# Patient Record
Sex: Female | Born: 1955 | Race: White | Hispanic: No | Marital: Single | Smoking: Current every day smoker
Health system: Southern US, Community
[De-identification: ages and names within clinical notes are randomized; demographics above are authoritative.]

## PROBLEM LIST (undated history)

## (undated) DIAGNOSIS — M1990 Unspecified osteoarthritis, unspecified site: Secondary | ICD-10-CM

## (undated) DIAGNOSIS — M21619 Bunion of unspecified foot: Secondary | ICD-10-CM

## (undated) DIAGNOSIS — T7840XA Allergy, unspecified, initial encounter: Secondary | ICD-10-CM

## (undated) DIAGNOSIS — M549 Dorsalgia, unspecified: Secondary | ICD-10-CM

## (undated) DIAGNOSIS — G5600 Carpal tunnel syndrome, unspecified upper limb: Secondary | ICD-10-CM

## (undated) DIAGNOSIS — R002 Palpitations: Secondary | ICD-10-CM

## (undated) HISTORY — PX: CHOLECYSTECTOMY: SHX55

## (undated) HISTORY — DX: Unspecified osteoarthritis, unspecified site: M19.90

## (undated) HISTORY — DX: Bunion of unspecified foot: M21.619

## (undated) HISTORY — PX: BACK SURGERY: SHX140

## (undated) HISTORY — DX: Allergy, unspecified, initial encounter: T78.40XA

## (undated) HISTORY — PX: ABDOMINAL HYSTERECTOMY: SHX81

## (undated) HISTORY — DX: Carpal tunnel syndrome, unspecified upper limb: G56.00

---

## 1978-04-23 HISTORY — PX: TUBAL LIGATION: SHX77

## 1988-04-23 HISTORY — PX: GUM SURGERY: SHX658

## 2003-04-24 HISTORY — PX: BACK SURGERY: SHX140

## 2003-04-24 HISTORY — PX: NECK SURGERY: SHX720

## 2010-06-29 DIAGNOSIS — F17200 Nicotine dependence, unspecified, uncomplicated: Secondary | ICD-10-CM | POA: Insufficient documentation

## 2010-06-29 DIAGNOSIS — Q631 Lobulated, fused and horseshoe kidney: Secondary | ICD-10-CM | POA: Insufficient documentation

## 2010-06-29 DIAGNOSIS — N200 Calculus of kidney: Secondary | ICD-10-CM | POA: Insufficient documentation

## 2010-07-31 DIAGNOSIS — M47812 Spondylosis without myelopathy or radiculopathy, cervical region: Secondary | ICD-10-CM | POA: Insufficient documentation

## 2010-07-31 DIAGNOSIS — M549 Dorsalgia, unspecified: Secondary | ICD-10-CM | POA: Insufficient documentation

## 2011-03-05 ENCOUNTER — Emergency Department: Payer: Self-pay | Admitting: *Deleted

## 2012-05-30 DIAGNOSIS — R002 Palpitations: Secondary | ICD-10-CM | POA: Insufficient documentation

## 2012-05-30 DIAGNOSIS — M159 Polyosteoarthritis, unspecified: Secondary | ICD-10-CM | POA: Insufficient documentation

## 2012-12-15 DIAGNOSIS — J309 Allergic rhinitis, unspecified: Secondary | ICD-10-CM | POA: Insufficient documentation

## 2014-07-28 ENCOUNTER — Ambulatory Visit (INDEPENDENT_AMBULATORY_CARE_PROVIDER_SITE_OTHER): Payer: BLUE CROSS/BLUE SHIELD

## 2014-07-28 ENCOUNTER — Encounter: Payer: Self-pay | Admitting: Podiatry

## 2014-07-28 ENCOUNTER — Ambulatory Visit (INDEPENDENT_AMBULATORY_CARE_PROVIDER_SITE_OTHER): Payer: BLUE CROSS/BLUE SHIELD | Admitting: Podiatry

## 2014-07-28 VITALS — BP 115/74 | HR 62 | Resp 16 | Ht 64.0 in | Wt 153.0 lb

## 2014-07-28 DIAGNOSIS — M79671 Pain in right foot: Secondary | ICD-10-CM

## 2014-07-28 DIAGNOSIS — M7751 Other enthesopathy of right foot: Secondary | ICD-10-CM

## 2014-07-28 DIAGNOSIS — M779 Enthesopathy, unspecified: Secondary | ICD-10-CM

## 2014-07-28 DIAGNOSIS — M778 Other enthesopathies, not elsewhere classified: Secondary | ICD-10-CM

## 2014-07-28 MED ORDER — MELOXICAM 15 MG PO TABS: 15.0000 mg | ORAL_TABLET | Freq: Every day | ORAL | Status: DC

## 2014-07-28 MED ORDER — METHYLPREDNISOLONE (PAK) 4 MG PO TABS: ORAL_TABLET | ORAL | Status: DC

## 2014-07-28 NOTE — Progress Notes (Signed)
   Subjective:    Patient ID: Gray Bernhardtolly Biedermann, female    DOB: 1956-01-03, 59 y.o.   MRN: [id number]  HPI    Review of Systems  Constitutional: Positive for fatigue.  HENT: Positive for sinus pressure.   All other systems reviewed and are negative.      Objective:   Physical Exam: I have reviewed her past medical history medications allergies surgery social history and review of systems. Pulses are strongly palpable bilateral. Neurologic sensorium is intact percent was the monofilament. Deep tendon reflexes are intact bilateral and muscle strength is 5 over 5 dorsiflexion plantar flexors and inverters everters all intrinsic musculature is intact. Orthopedic evaluation demonstrates hallux abductovalgus deformity of the bilateral foot right greater than left with hammertoe deformity second digit right is mild deviation of the second digit of the metatarsophalangeal joint and pain on palpation and in range of motion of the second metatarsophalangeal joint right foot. Radiographs confirm hallux abductovalgus deformity with plantarflexed elongated second metatarsal and hammertoe deformity. Cutaneous evaluation demonstrates supple well-hydrated cutis no erythema edema saline as drainage or odor with the exception of mild erythema to the plantar aspect of the second metatarsophalangeal joint which is painful on palpation.        Assessment & Plan:  Assessment: Hallux abductovalgus deformity with chronic capsulitis second metatarsophalangeal joint right foot hammertoe deformity second right.  Plan: Discussed the etiology pathology conservative versus surgical therapies. At this point I injected peri-articularly today with Kenalog and local anesthetic after sterile Betadine skin prep. Started her on a Medrol Dosepak to be followed by meloxicam. Placed her in a Darco shoe.

## 2014-08-25 ENCOUNTER — Ambulatory Visit: Payer: BLUE CROSS/BLUE SHIELD | Admitting: Podiatry

## 2014-09-15 ENCOUNTER — Ambulatory Visit (INDEPENDENT_AMBULATORY_CARE_PROVIDER_SITE_OTHER): Payer: BLUE CROSS/BLUE SHIELD | Admitting: Podiatry

## 2014-09-15 ENCOUNTER — Encounter: Payer: Self-pay | Admitting: Podiatry

## 2014-09-15 VITALS — BP 114/68 | HR 69 | Resp 16

## 2014-09-15 DIAGNOSIS — G5790 Unspecified mononeuropathy of unspecified lower limb: Secondary | ICD-10-CM | POA: Diagnosis not present

## 2014-09-15 MED ORDER — PREGABALIN 75 MG PO CAPS: 75.0000 mg | ORAL_CAPSULE | Freq: Two times a day (BID) | ORAL | Status: DC

## 2014-09-15 NOTE — Progress Notes (Signed)
She presents today for follow-up of her capsulitis of her bilateral foot second metatarsophalangeal joint. She states that the meloxicam and the injections really seem to help with the pain in the forefoot. However she notes that she is still having severe burning pain throughout the day and particularly at night.  Objective: Vital signs are stable alert and oriented 3. Pulses are strongly palpable bilateral. Neurologic sensorium is intact per Semmes-Weinstein monofilament. Deep tendon reflexes are intact bilateral.. Decrease in tenderness on range of motion of the second metatarsophalangeal joint.  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 Resolving capsulitis second metatarsophalangeal joint bilateral.  Plan: Discussed etiology pathology conservative versus surgical therapies. Started her on Lyrica 75 mg 1 twice daily and I will follow-up with her in 1 month. We discussed the pros and cons of Lyrica including all the side effects. She will call with concerns.

## 2014-10-04 ENCOUNTER — Emergency Department: Payer: 59

## 2014-10-04 ENCOUNTER — Emergency Department
Admission: EM | Admit: 2014-10-04 | Discharge: 2014-10-04 | Disposition: A | Discharge status: Home / Self Care | Payer: 59 | Attending: Emergency Medicine | Admitting: Emergency Medicine

## 2014-10-04 ENCOUNTER — Encounter: Payer: Self-pay | Admitting: Emergency Medicine

## 2014-10-04 DIAGNOSIS — Z720 Tobacco use: Secondary | ICD-10-CM | POA: Insufficient documentation

## 2014-10-04 DIAGNOSIS — G8929 Other chronic pain: Secondary | ICD-10-CM | POA: Diagnosis not present

## 2014-10-04 DIAGNOSIS — Z981 Arthrodesis status: Secondary | ICD-10-CM | POA: Insufficient documentation

## 2014-10-04 DIAGNOSIS — Z79899 Other long term (current) drug therapy: Secondary | ICD-10-CM | POA: Diagnosis not present

## 2014-10-04 DIAGNOSIS — M545 Low back pain: Secondary | ICD-10-CM | POA: Insufficient documentation

## 2014-10-04 DIAGNOSIS — M542 Cervicalgia: Secondary | ICD-10-CM | POA: Insufficient documentation

## 2014-10-04 DIAGNOSIS — R350 Frequency of micturition: Secondary | ICD-10-CM | POA: Insufficient documentation

## 2014-10-04 DIAGNOSIS — M549 Dorsalgia, unspecified: Secondary | ICD-10-CM

## 2014-10-04 DIAGNOSIS — Z791 Long term (current) use of non-steroidal anti-inflammatories (NSAID): Secondary | ICD-10-CM | POA: Diagnosis not present

## 2014-10-04 HISTORY — DX: Dorsalgia, unspecified: M54.9

## 2014-10-04 HISTORY — DX: Palpitations: R00.2

## 2014-10-04 LAB — URINALYSIS COMPLETE WITH MICROSCOPIC (ARMC ONLY)
Bacteria, UA: NONE SEEN
Bilirubin Urine: NEGATIVE
GLUCOSE, UA: NEGATIVE mg/dL
KETONES UR: NEGATIVE mg/dL
Leukocytes, UA: NEGATIVE
Nitrite: NEGATIVE
PH: 6 (ref 5.0–8.0)
Protein, ur: NEGATIVE mg/dL
Specific Gravity, Urine: 1.002 — ABNORMAL LOW (ref 1.005–1.030)

## 2014-10-04 MED ORDER — ORPHENADRINE CITRATE 30 MG/ML IJ SOLN
60.0000 mg | Freq: Two times a day (BID) | INTRAMUSCULAR | Status: DC
  Administered 2014-10-04: 60 mg via INTRAMUSCULAR

## 2014-10-04 MED ORDER — KETOROLAC TROMETHAMINE 60 MG/2ML IM SOLN
60.0000 mg | Freq: Once | INTRAMUSCULAR | Status: AC
  Administered 2014-10-04: 60 mg via INTRAMUSCULAR

## 2014-10-04 MED ORDER — KETOROLAC TROMETHAMINE 60 MG/2ML IM SOLN
INTRAMUSCULAR | Status: AC
  Filled 2014-10-04: qty 2

## 2014-10-04 MED ORDER — ORPHENADRINE CITRATE 30 MG/ML IJ SOLN
INTRAMUSCULAR | Status: AC
  Administered 2014-10-04: 60 mg via INTRAMUSCULAR
  Filled 2014-10-04: qty 2

## 2014-10-04 MED ORDER — CYCLOBENZAPRINE HCL 10 MG PO TABS: 10.0000 mg | ORAL_TABLET | Freq: Three times a day (TID) | ORAL | Status: DC | PRN

## 2014-10-04 NOTE — Discharge Instructions (Signed)
Alternate heat and ice for comfort. Take medication as prescribed. Continue home Mobic for pain as needed. Do NOT take hydromorphone pain medication with muscle relaxant as discussed. This is very important.  Follow-up with her primary care physician. Also see above for follow-up.  Return to the ER for new or worsening concerns.  Back Pain, Adult Low back pain is very common. About 1 in 5 people have back pain.The cause of low back pain is rarely dangerous. The pain often gets better over time.About half of people with a sudden onset of back pain feel better in just 2 weeks. About 8 in 10 people feel better by 6 weeks.  CAUSES Some common causes of back pain include:  Strain of the muscles or ligaments supporting the spine.  Wear and tear (degeneration) of the spinal discs.  Arthritis.  Direct injury to the back. DIAGNOSIS Most of the time, the direct cause of low back pain is not known.However, back pain can be treated effectively even when the exact cause of the pain is unknown.Answering your caregiver's questions about your overall health and symptoms is one of the most accurate ways to make sure the cause of your pain is not dangerous. If your caregiver needs more information, he or she may order lab work or imaging tests (X-rays or MRIs).However, even if imaging tests show changes in your back, this usually does not require surgery. HOME CARE INSTRUCTIONS For many people, back pain returns.Since low back pain is rarely dangerous, it is often a condition that people can learn to [facility] their own.   Remain active. It is stressful on the back to sit or stand in one place. Do not sit, drive, or stand in one place for more than 30 minutes at a time. Take short walks on level surfaces as soon as pain allows.Try to increase the length of time you walk each day.  Do not stay in bed.Resting more than 1 or 2 days can delay your recovery.  Do not avoid exercise or work.Your body is  made to move.It is not dangerous to be active, even though your back may hurt.Your back will likely heal faster if you return to being active before your pain is gone.  Pay attention to your body when you bend and lift. Many people have less discomfortwhen lifting if they bend their knees, keep the load close to their bodies,and avoid twisting. Often, the most comfortable positions are those that put less stress on your recovering back.  Find a comfortable position to sleep. Use a firm mattress and lie on your side with your knees slightly bent. If you lie on your back, put a pillow under your knees.  Only take over-the-counter or prescription medicines as directed by your caregiver. Over-the-counter medicines to reduce pain and inflammation are often the most helpful.Your caregiver may prescribe muscle relaxant drugs.These medicines help dull your pain so you can more quickly return to your normal activities and healthy exercise.  Put ice on the injured area.  Put ice in a plastic bag.  Place a towel between your skin and the bag.  Leave the ice on for 15-20 minutes, 03-04 times a day for the first 2 to 3 days. After that, ice and heat may be alternated to reduce pain and spasms.  Ask your caregiver about trying back exercises and gentle massage. This may be of some benefit.  Avoid feeling anxious or stressed.Stress increases muscle tension and can worsen back pain.It is important to recognize when  you are anxious or stressed and learn ways to manage it.Exercise is a great option. SEEK MEDICAL CARE IF:  You have pain that is not relieved with rest or medicine.  You have pain that does not improve in 1 week.  You have new symptoms.  You are generally not feeling well. SEEK IMMEDIATE MEDICAL CARE IF:   You have pain that radiates from your back into your legs.  You develop new bowel or bladder control problems.  You have unusual weakness or numbness in your arms or  legs.  You develop nausea or vomiting.  You develop abdominal pain.  You feel faint. Document Released: 04/09/2005 Document Revised: 10/09/2011 Document Reviewed: 08/11/2013 [facility] Patient Information 2015 [place], [place]. This information is not intended to replace advice given to you by your health care provider. Make sure you discuss any questions you have with your health care provider.

## 2014-10-04 NOTE — ED Notes (Signed)
States has all over aches and pains but particularly back pain. States has history of back pain but worse x 1 month with no new injury. No fevers. No dysuria.

## 2014-10-04 NOTE — ED Provider Notes (Signed)
[facility] Emergency Department Provider Note ____________________________________________  Time seen: Approximately 10:07 AM  I have reviewed the triage vital signs and the nursing notes.   HISTORY  Chief Complaint Back Pain   HPI Michelle Boyle is a 59 y.o. female presents to the ER for complaints of neck and back pain. Patient reports that she has chronic pain in all of her joints, however states that for the last week or 2 she has had increased pain in her neck and lower back. Reports history of cervical and lumbar fusion. Patient denies fall or other injury. Patient reports that she has hydromorphone 2 mg tablets as needed for pain as well as 15 mg Mobic daily for pain. States the hydromorphone has only been taken once or twice but did not relieve the pain.   States pain is currently 8 out of 10 to neck and lower back. Denies pain radiation. Patient states pain is constant always, worse with movement. Patient does report that she has had this pain for years however states that it intermittently flares up. Patient again denies fall or injury.  Denies chest pain, shortness of breath, abdominal pain, fever, vomiting. Reports occasional urinary frequency. Also reports occasional nausea when pain increases. Denies current nausea. States that she has seen her primary care physician in past for similar and was referred to spine clinic and [facility]. Patient states that she recently moved to [place].  Patient reports that she was recently seen in 2-3 weeks ago at [facility] ER for acute diverticulitis. Patient states that that has improved and resolved.States that's when she received the hydromorphone tablets.    Past Medical History  Diagnosis Date  . Back pain   . Palpitations    chronic neck and back pain Migraines   There are no active problems to display for this patient.   Past Surgical History  Procedure Laterality Date  . Back surgery    . Abdominal hysterectomy     . Cholecystectomy     cervical and lumbar fusion  Current Outpatient Rx  Name  Route  Sig  Dispense  Refill  . atenolol-chlorthalidone (TENORETIC) 50-25 MG per tablet   Oral   Take 1 tablet by mouth daily.         . cyclobenzaprine (FLEXERIL) 10 MG tablet   Oral   Take 1 tablet (10 mg total) by mouth every 8 (eight) hours as needed for muscle spasms (PRN pain. Do not drive or operate heavy machinery while taking as can cause drowsiness. DO NOT take with other pain medication.).   12 tablet   0   . meloxicam (MOBIC) 15 MG tablet   Oral   Take 1 tablet (15 mg total) by mouth daily.   30 tablet   3   . pregabalin (LYRICA) 75 MG capsule   Oral   Take 1 capsule (75 mg total) by mouth 2 (two) times daily.   60 capsule   3   . sertraline (ZOLOFT) 100 MG tablet   Oral   Take 100 mg by mouth daily.          hydromorphone tabs  tab.  #10 in bottle in room  Allergies Codeine; Levaquin; and Sulfa antibiotics  No family history on file.  Social History History  Substance Use Topics  . Smoking status: Current Every Day Smoker -- 0.50 packs/day    Types: Cigarettes  . Smokeless tobacco: Not on file  . Alcohol Use: No    Review of Systems  Constitutional: No fever/chills Eyes: No visual changes. ENT: No sore throat. Cardiovascular: Denies chest pain. Respiratory: Denies shortness of breath. Gastrointestinal: No abdominal pain.  No nausea, no vomiting.  No diarrhea.  No constipation. Genitourinary: Negative for dysuria. Musculoskeletal: Positive for neck and for back pain. Skin: Negative for rash. Neurological: Negative for headaches, focal weakness or numbness.  10-point ROS otherwise negative.  ____________________________________________   PHYSICAL EXAM:  VITAL SIGNS: ED Triage Vitals  Enc Vitals Group     BP 10/04/14 0934 137/70 mmHg     Pulse Rate 10/04/14 0934 74     Resp 10/04/14 0934 20     Temp 10/04/14 0934 98.2 F (36.8 C)     Temp Source  10/04/14 0934 Oral     SpO2 10/04/14 0934 96 %     Weight 10/04/14 0934 155 lb (70.308 kg)     Height 10/04/14 0934 5\' 4"  (1.626 m)     Head Cir --      Peak Flow --      Pain Score 10/04/14 0935 10     Pain Loc --      Pain Edu? --      Excl. in GC? --     Constitutional: Alert and oriented. Well appearing and in no acute distress. Eyes: Conjunctivae are normal. PERRL. EOMI. Head: Atraumatic. Nose: No congestion/rhinnorhea. Mouth/Throat: Mucous membranes are moist.  Oropharynx non-erythematous. Neck: No stridor.  No cervical spine tenderness to palpation. Hematological/Lymphatic/Immunilogical: No cervical lymphadenopathy. Cardiovascular: Normal rate, regular rhythm. Grossly normal heart sounds.  Good peripheral circulation. Respiratory: Normal respiratory effort.  No retractions. Lungs CTAB. Gastrointestinal: Soft and nontender. No distention. No abdominal bruits. No CVA tenderness. Musculoskeletal: No lower extremity tenderness nor edema.  No joint effusions. Mild to mod cervical and paracervical TTP, mild to mod TTP lumbar and paralumbar TTP, Full ROM. Changes positions from lying to standing quickly without difficulty or distress. Steady gait. Bilateral pedal pulses equal and easily found. No saddle anesthesia. Sensation intact bilaterally to upper and lower extremities. Bilateral hand grips equal.  Neurologic:  Normal speech and language. No gross focal neurologic deficits are appreciated. Speech is normal. No gait instability. CN2-12 grossly intact.  Skin:  Skin is warm, dry and intact. No rash noted. Psychiatric: Mood and affect are normal. Speech and behavior are normal.  ____________________________________________   LABS (all labs ordered are listed, but only abnormal results are displayed)  Labs Reviewed  URINALYSIS COMPLETEWITH MICROSCOPIC ([facility] ONLY) - Abnormal; Notable for the following:    Color, Urine STRAW (*)    APPearance CLEAR (*)    Specific Gravity, Urine  1.002 (*)    Hgb urine dipstick 1+ (*)    Squamous Epithelial / LPF 0-5 (*)    All other components within normal limits  URINE CULTURE   ____________________________________________  __________________________________________  RADIOLOGY LUMBAR SPINE - COMPLETE 4+ VIEW  COMPARISON: None.  FINDINGS: Five views of lumbar spine submitted. No acute fracture or subluxation. There is posterior metallic fusion with metallic rods and transpedicular screws at L5-S1 level. Alignment and vertebral body heights are preserved. Mild facet degenerative changes L3 and L4 level.  IMPRESSION: No acute fracture or subluxation. Postsurgical changes at L5-S1 level. Mild degenerative changes as described above.   Electronically Signed By: Natasha Mead M.D. On: 10/04/2014 10:58 CT CERVICAL SPINE WITHOUT CONTRAST  TECHNIQUE: Multidetector CT imaging of the cervical spine was performed without intravenous contrast. Multiplanar CT image reconstructions were also generated.  COMPARISON: None.  FINDINGS: Slight straightening  of the normal cervical lordosis. C5-6 anterior cervical fusion without complicating feature. Vertebral body height is maintained. Endplate degenerative changes and loss of disc space height are seen at C6-7. Multilevel facet hypertrophy. Mild-to-moderate bilateral neural foraminal narrowing at C5-6 and C6-7.  Visualized portions of the lung apices show no acute findings. Soft tissues are unremarkable.  IMPRESSION: 1. No acute findings. 2. Multilevel facet hypertrophy with degenerative disc disease at C6-7. 3. C5-6 anterior cervical fusion.   Electronically Signed By: Leanna Battles M.D. On: 10/04/2014 10:47 ____________________________________________   INITIAL IMPRESSION / ASSESSMENT AND PLAN / ED COURSE  Pertinent labs & imaging results that were available during my care of the patient were reviewed by me and considered in my medical decision  making (see chart for details).  No acute distress. Very well-appearing patient. Presents to the ER for acute on chronic neck and low back pain. Denies fall or injury. Reports has been following with [facility] spine clinic in past for similar.  Patient ambulatory in ER and relating in hallway. Presents to the ER for acute on chronic neck and back pain. CT of neck revealing no acute findings as well as lumbar x-ray revealing no acute fracture or subluxation. Discussed with patient need to follow up closely with primary care physician. Discussed also will refer for pain management. We'll treat patient with IM Toradol and Norflex once in ER. Patient states that she does not like taking the hydromorphone as it does not work and makes her drowsy. Discussed not taking hydromorphone, will prescribe Flexeril as needed for pain. Discussed importance of monitoring medication use and to not take pain medications together. Patient verbalized understanding. Friend at bedside also verbalized understanding. Discussed strict follow-up and return parameters. ____________________________________________   FINAL CLINICAL IMPRESSION(S) / ED DIAGNOSES  Final diagnoses:  Chronic neck and back pain   Acute on chronic neck and back pain   Renford Dills, NP 10/04/14 1208  Phineas Semen, MD 10/04/14 [phone]

## 2014-10-04 NOTE — ED Notes (Signed)
Pt states that she has pain in her back and neck. Has a history of back and neck pain. States that she has nausea with pain. No apparent distress. Airway and circulation intact.

## 2014-10-06 LAB — URINE CULTURE: Culture: 10000

## 2014-10-11 ENCOUNTER — Ambulatory Visit (INDEPENDENT_AMBULATORY_CARE_PROVIDER_SITE_OTHER): Payer: 59 | Admitting: Family Medicine

## 2014-10-11 ENCOUNTER — Encounter: Payer: Self-pay | Admitting: Family Medicine

## 2014-10-11 VITALS — BP 111/76 | HR 73 | Temp 97.8°F | Resp 16 | Ht 64.0 in | Wt 158.8 lb

## 2014-10-11 DIAGNOSIS — Z0289 Encounter for other administrative examinations: Secondary | ICD-10-CM | POA: Diagnosis not present

## 2014-10-11 DIAGNOSIS — F329 Major depressive disorder, single episode, unspecified: Secondary | ICD-10-CM

## 2014-10-11 DIAGNOSIS — G5601 Carpal tunnel syndrome, right upper limb: Secondary | ICD-10-CM

## 2014-10-11 DIAGNOSIS — G8929 Other chronic pain: Secondary | ICD-10-CM

## 2014-10-11 DIAGNOSIS — G5603 Carpal tunnel syndrome, bilateral upper limbs: Secondary | ICD-10-CM

## 2014-10-11 DIAGNOSIS — K5732 Diverticulitis of large intestine without perforation or abscess without bleeding: Secondary | ICD-10-CM

## 2014-10-11 DIAGNOSIS — Z8249 Family history of ischemic heart disease and other diseases of the circulatory system: Secondary | ICD-10-CM | POA: Diagnosis not present

## 2014-10-11 DIAGNOSIS — M549 Dorsalgia, unspecified: Secondary | ICD-10-CM | POA: Diagnosis not present

## 2014-10-11 DIAGNOSIS — R202 Paresthesia of skin: Secondary | ICD-10-CM | POA: Diagnosis not present

## 2014-10-11 DIAGNOSIS — G5602 Carpal tunnel syndrome, left upper limb: Secondary | ICD-10-CM | POA: Diagnosis not present

## 2014-10-11 DIAGNOSIS — F32A Depression, unspecified: Secondary | ICD-10-CM

## 2014-10-11 DIAGNOSIS — M542 Cervicalgia: Secondary | ICD-10-CM

## 2014-10-11 DIAGNOSIS — Z79891 Long term (current) use of opiate analgesic: Secondary | ICD-10-CM

## 2014-10-11 MED ORDER — ETODOLAC 200 MG PO CAPS: 400.0000 mg | ORAL_CAPSULE | Freq: Three times a day (TID) | ORAL | Status: DC

## 2014-10-11 MED ORDER — OXYCODONE-ACETAMINOPHEN 5-325 MG PO TABS: 0.5000 | ORAL_TABLET | Freq: Three times a day (TID) | ORAL | Status: DC | PRN

## 2014-10-11 NOTE — Patient Instructions (Signed)
Back pain: Refer for PT. Refer to pain management. Please keep appt with [facility]  Alarm symptoms: Incontinence of bowel or bladder Progressive numbness or tingling for the legs Progressive weakness of the legs. Inability to feel your pelvis. Go straight to the ER>   Carpal Tunnel: Try etodolac three times daily. Wear braces. Keep appt with your orthopedist.

## 2014-10-11 NOTE — Assessment & Plan Note (Signed)
Continue sertraline. Check Vitamin D today.

## 2014-10-11 NOTE — Progress Notes (Signed)
Subjective:    Patient ID: Michelle Boyle, female    DOB: 05-15-1955, 59 y.o.   MRN: [id number]  HPI: Michelle Boyle is a 59 y.o. female presenting on 10/11/2014 for Establish Care   HPI  Pt presents to establish care today. Previous care was Daryel November at [facility].  Currently podiatry (Dr. Al Corpus)  for foot pain.  Orthopedist for carpal tunnel in [place].   Chronic neck and back pain: Spinal fusion in 2005. Pt has chronic pain and is schedule to see [facility] in August. She was seen in the ER for chronic pain last week. Given muscle relaxers to take as needed. She would like to get into chronic pain to be treated. She has degenerative disc disease at C5-6 and C6-7.  Carpal Tunnel: Seeing orthopedist in [place].  Dr. Georgina Pillion. Was scheduled for nerve conduction study but missed it. Pt was unsure if there are insurance issues. May need new orthopedist.  Diverticulitis: Seen in [facility] ER 5/27. Pt was given antibiotics. Pt has history of diverticulosis. Stomach is feeling back to normal. Colonscopy 2014. Pt is due for Colonscopy. Foot pain: taking meloxicam  daily.  Was put on lyrica for neuropathy but she cannot afford.  Depression: Takes  sertraline daily.  Pt feels her depression is not as well controlled due to chronic pain.  Tachycardia/ heart palpitations: Atenolol  at bedtime for HR. Controlled.    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 Does not have to do as much lifting. Gets tired after 30-1hr because of pain. Pt is unable to lift much <10lbs due to carpal tunnel. Health Maintenance. Had hysterectomy- no pap due; Due for colonscopy. Mammogram 2014- normal. Gets them done at [facility]. Due this year but wants to wait to deal with back pain.    Past Medical History  Diagnosis Date  . Back pain   . Palpitations   . Allergy   . Arthritis   . Carpal tunnel syndrome   . Bunion    History   Social History  . Marital Status: Unknown    Spouse Name: N/A  . Number of  Children: N/A  . Years of Education: N/A   Occupational History  . Not on file.   Social History Main Topics  . Smoking status: Current Every Day Smoker -- 0.50 packs/day    Types: Cigarettes  . Smokeless tobacco: Not on file  . Alcohol Use: No  . Drug Use: Not on file  . Sexual Activity: Not on file   Other Topics Concern  . Not on file   Social History Narrative   Family History  Problem Relation Age of Onset  . Heart disease Father   . Heart disease Sister   . Cancer Sister   . Cancer Brother     pencreatic and bladder  . Cancer Maternal Grandmother   . Cancer Paternal Grandmother    Current Outpatient Prescriptions on File Prior to Visit  Medication Sig  . atenolol-chlorthalidone (TENORETIC) 50-25 MG per tablet Take 1 tablet by mouth daily.  . sertraline (ZOLOFT) 100 MG tablet Take 100 mg by mouth daily.   No current facility-administered medications on file prior to visit.    Review of Systems  Eyes: Negative.   Respiratory: Negative for chest tightness, shortness of breath and wheezing.   Cardiovascular: Negative for chest pain, palpitations and leg swelling.  Gastrointestinal: Negative for vomiting, abdominal pain, constipation, blood in stool and rectal pain.  Endocrine: Negative for polydipsia, polyphagia  and polyuria.  Musculoskeletal: Positive for back pain and neck pain. Negative for joint swelling.  Neurological: Positive for numbness (bilateral hands. ). Negative for dizziness and weakness.  Psychiatric/Behavioral: Positive for dysphoric mood. Negative for suicidal ideas.   Per HPI unless specifically indicated above     Objective:    BP 111/76 mmHg  Pulse 73  Temp(Src) 97.8 F (36.6 C) (Oral)  Resp 16  Ht 5\' 4"  (1.626 m)  Wt 158 lb 12.8 oz (72.031 kg)  BMI 27.24 kg/m2  LMP   Wt Readings from Last 3 Encounters:  10/11/14 158 lb 12.8 oz (72.031 kg)  10/04/14 155 lb (70.308 kg)  07/28/14 153 lb (69.4 kg)    Physical Exam  Constitutional:  She is oriented to person, place, and time. She appears well-developed and well-nourished. No distress.  HENT:  Head: Normocephalic and atraumatic.  Neck: Normal range of motion.  Cardiovascular: Normal rate and regular rhythm.  Exam reveals no gallop and no friction rub.   No murmur heard. Pulmonary/Chest: Effort normal and breath sounds normal. She has no wheezes. She exhibits no tenderness.  Musculoskeletal:       Cervical back: She exhibits tenderness and pain. She exhibits normal range of motion, no swelling and no edema.  Lymphadenopathy:    She has no cervical adenopathy.  Neurological: She is alert and oriented to person, place, and time. She has normal reflexes. She displays no atrophy and no tremor. A sensory deficit is present. No cranial nerve deficit. She exhibits abnormal muscle tone (L arm is 4/5 strength compared to R arm 5/5). She displays a negative Romberg sign. She displays no seizure activity.  Diminished sensation to monofilament bilateral toes.  +tinel sign bilateral hands.    Skin: She is not diaphoretic.  Psychiatric: Her behavior is normal. Judgment and thought content normal. Cognition and memory are normal. She exhibits a depressed mood.   Results for orders placed or performed during the hospital encounter of 10/04/14  Urine culture  Result Value Ref Range   Specimen Description URINE, RANDOM    Special Requests NONE    Culture 10,000 COLONIES/mL ESCHERICHIA COLI    Report Status 10/06/2014 FINAL    Organism ID, Bacteria ESCHERICHIA COLI       Susceptibility   Escherichia coli - MIC*    AMPICILLIN <=2 SENSITIVE Sensitive     CEFTAZIDIME <=1 SENSITIVE Sensitive     CEFAZOLIN <=4 SENSITIVE Sensitive     CEFTRIAXONE <=1 SENSITIVE Sensitive     CIPROFLOXACIN <=0.25 SENSITIVE Sensitive     GENTAMICIN <=1 SENSITIVE Sensitive     IMIPENEM <=0.25 SENSITIVE Sensitive     TRIMETH/SULFA <=20 SENSITIVE Sensitive     CEFOXITIN <=4 SENSITIVE Sensitive      NITROFURANTOIN Value in next row Sensitive      SENSITIVE<=16    * 10,000 COLONIES/mL ESCHERICHIA COLI  Urinalysis complete, with microscopic ([facility] only)  Result Value Ref Range   Color, Urine STRAW (A) YELLOW   APPearance CLEAR (A) CLEAR   Glucose, UA NEGATIVE NEGATIVE mg/dL   Bilirubin Urine NEGATIVE NEGATIVE   Ketones, ur NEGATIVE NEGATIVE mg/dL   Specific Gravity, Urine 1.002 (L) 1.005 - 1.030   Hgb urine dipstick 1+ (A) NEGATIVE   pH 6.0 5.0 - 8.0   Protein, ur NEGATIVE NEGATIVE mg/dL   Nitrite NEGATIVE NEGATIVE   Leukocytes, UA NEGATIVE NEGATIVE   RBC / HPF 0-5 0 - 5 RBC/hpf   WBC, UA 0-5 0 - 5 WBC/hpf  Bacteria, UA NONE SEEN NONE SEEN   Squamous Epithelial / LPF 0-5 (A) NONE SEEN      Assessment & Plan:   Problem List Items Addressed This Visit      Digestive   Diverticulitis of colon - Primary    Diet changes reviewed. Pt reporting no symptoms.       Relevant Medications   oxyCODONE-acetaminophen (ROXICET) 5-325 MG per tablet   Other Relevant Orders   CBC with Differential     Nervous and Auditory   Carpal tunnel syndrome, bilateral    Trial of etodolac for pain. Please continue to follow with orthopedics for consideration of surgery.         Other   Chronic neck and back pain    Pt would like chronic pain management. She will also likely need to be evaluated by a neurosurgeon for evaluation of if she needs further surgery. Pt has appt on [facility] on August 9.   Referred to [facility]. Will provide occasional narcotics until then. Drug test completed today and interim pain contract signed.   Encouraged PT and prescription for [facility] given.   Pt advised to avoid mixing narcotics with any medication that can make you drowsy. 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       Relevant Medications   etodolac (LODINE) 200 MG capsule   oxyCODONE-acetaminophen (ROXICET) 5-325 MG per tablet   Other Relevant Orders   Ambulatory referral to Pain  Clinic   Depression    Continue sertraline. Check Vitamin D today.       Relevant Orders   Vit D  25 hydroxy (rtn osteoporosis monitoring)    Other Visit Diagnoses    Family history of heart disease        Check lipid panel.     Relevant Orders    Lipid Profile    Paresthesia of both hands        Likely carpal tunnel but r/o vitamin deficiency.     Relevant Orders    Vitamin B12    Opioid use agreement exists        Relevant Orders    Drug Screen, Urine       Meds ordered this encounter  Medications  . albuterol (VENTOLIN HFA) 108 (90 BASE) MCG/ACT inhaler    Sig: Inhale into the lungs.  . cetirizine (ZYRTEC) 10 MG tablet    Sig: Take 10 mg by mouth.  . fluticasone (FLONASE) 50 MCG/ACT nasal spray    Sig: 1-2 sprays in each nostril qday as needed  . Multiple Vitamin (MULTI-VITAMINS) TABS    Sig: Take by mouth.  . etodolac (LODINE) 200 MG capsule    Sig: Take 2 capsules (400 mg total) by mouth 3 (three) times daily.    Dispense:  84 capsule    Refill:  3    Order Specific Question:  Supervising Provider    Answer:  Janeann Forehand [[id number]]  . oxyCODONE-acetaminophen (ROXICET) 5-325 MG per tablet    Sig: Take 0.5 tablets by mouth every 8 (eight) hours as needed for severe pain.    Dispense:  10 tablet    Refill:  0    Order Specific Question:  Supervising Provider    Answer:  Janeann Forehand [[id number]]      Follow up plan: Return in about 2 weeks (around 10/25/2014).

## 2014-10-11 NOTE — Assessment & Plan Note (Signed)
Trial of etodolac for pain. Please continue to follow with orthopedics for consideration of surgery.

## 2014-10-11 NOTE — Assessment & Plan Note (Signed)
Diet changes reviewed. Pt reporting no symptoms.

## 2014-10-11 NOTE — Assessment & Plan Note (Addendum)
Pt would like chronic pain management. She will also likely need to be evaluated by a neurosurgeon for evaluation of if she needs further surgery. Pt has appt on [facility] on August 9.   Referred to [facility]. Will provide occasional narcotics until then. Drug test completed today and interim pain contract signed.   Encouraged PT and prescription for [facility] given.   Pt advised to avoid mixing narcotics with any medication that can make you drowsy. 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

## 2014-10-12 LAB — CBC WITH DIFFERENTIAL/PLATELET
Basophils Absolute: 0 10*3/uL (ref 0.0–0.2)
Basos: 0 %
EOS (ABSOLUTE): 0.3 10*3/uL (ref 0.0–0.4)
EOS: 4 %
Hematocrit: 41.2 % (ref 34.0–46.6)
Hemoglobin: 14.7 g/dL (ref 11.1–15.9)
Immature Grans (Abs): 0 10*3/uL (ref 0.0–0.1)
Immature Granulocytes: 0 %
LYMPHS ABS: 2.2 10*3/uL (ref 0.7–3.1)
Lymphs: 35 %
MCH: 32.4 pg (ref 26.6–33.0)
MCHC: 35.7 g/dL (ref 31.5–35.7)
MCV: 91 fL (ref 79–97)
MONOS ABS: 0.6 10*3/uL (ref 0.1–0.9)
Monocytes: 10 %
Neutrophils Absolute: 3.2 10*3/uL (ref 1.4–7.0)
Neutrophils: 51 %
PLATELETS: 269 10*3/uL (ref 150–379)
RBC: 4.54 x10E6/uL (ref 3.77–5.28)
RDW: 13.6 % (ref 12.3–15.4)
WBC: 6.3 10*3/uL (ref 3.4–10.8)

## 2014-10-13 ENCOUNTER — Telehealth: Payer: Self-pay | Admitting: Family Medicine

## 2014-10-13 ENCOUNTER — Ambulatory Visit (INDEPENDENT_AMBULATORY_CARE_PROVIDER_SITE_OTHER): Payer: 59 | Admitting: Podiatry

## 2014-10-13 DIAGNOSIS — G5790 Unspecified mononeuropathy of unspecified lower limb: Secondary | ICD-10-CM

## 2014-10-13 DIAGNOSIS — E785 Hyperlipidemia, unspecified: Secondary | ICD-10-CM | POA: Insufficient documentation

## 2014-10-13 LAB — VITAMIN D 25 HYDROXY (VIT D DEFICIENCY, FRACTURES): Vit D, 25-Hydroxy: 30.2 ng/mL (ref 30.0–100.0)

## 2014-10-13 LAB — LIPID PANEL
CHOLESTEROL TOTAL: 280 mg/dL — AB (ref 100–199)
Chol/HDL Ratio: 6.4 ratio units — ABNORMAL HIGH (ref 0.0–4.4)
HDL: 44 mg/dL (ref >39)
LDL Calculated: 204 mg/dL — ABNORMAL HIGH (ref 0–99)
TRIGLYCERIDES: 162 mg/dL — AB (ref 0–149)

## 2014-10-13 LAB — VITAMIN B12: Vitamin B-12: 503 pg/mL (ref 211–946)

## 2014-10-13 MED ORDER — GABAPENTIN 100 MG PO CAPS: 100.0000 mg | ORAL_CAPSULE | Freq: Three times a day (TID) | ORAL | Status: DC

## 2014-10-13 MED ORDER — ATORVASTATIN CALCIUM 20 MG PO TABS: 20.0000 mg | ORAL_TABLET | Freq: Every day | ORAL | Status: DC

## 2014-10-13 NOTE — Progress Notes (Signed)
She presents today stating that she did not pick up the Lyrica because it is very expensive and was not covered by insurance. She states that she has had her back checked and she does have spinal stenosis.  Objective: Vital signs are stable she is alert and oriented 3 she still complaining of severe pain bilaterally.  Assessment: She has heel pain with neuropathy.  Plan: Encouraged her to start on gabapentin 100 mg just at night for the first 2 weeks and then as long she can tolerate it she is to expand to 3 times a day. She states that she did not actually have an allergy to this she just did not like the way it made her feel.

## 2014-10-13 NOTE — Telephone Encounter (Signed)
Called pt to review lab results.  Cholesterol is very high. Her LDL qualifies for high intensity statin. Discussed starting lipitor. Pt is amenable. Side effects reviewed. Start 20mg  of atorvastatin daily.  CBC, vitamin D, and B12 are WNL. AST was mildly elevated last labs. Will recheck CMP 6 weeks after starting statin.

## 2014-10-14 ENCOUNTER — Ambulatory Visit: Payer: Self-pay | Admitting: Family Medicine

## 2014-10-15 ENCOUNTER — Telehealth: Payer: Self-pay | Admitting: Family Medicine

## 2014-10-15 DIAGNOSIS — G5603 Carpal tunnel syndrome, bilateral upper limbs: Secondary | ICD-10-CM

## 2014-10-15 NOTE — Telephone Encounter (Signed)
Pt states she needs a neuro referral for her carpal tunnel as discussed with Amy Krebs, NP.  [facility]  Fax [phone]

## 2014-10-18 NOTE — Telephone Encounter (Signed)
Do you want me to refer  Her to neuro?

## 2014-10-19 NOTE — Telephone Encounter (Signed)
Spoke to pt needed a [facility] approval and number is [id number] and faxed to Dr. Rosanne AshingMassey's office and they will contact pt reg appt date and time. Nisha

## 2014-10-19 NOTE — Telephone Encounter (Signed)
Pt was seeing a provider for this already. This might be the same provider or her orthopedist might have requested it. I know she switched insurance companies recently and might need a referral for that reason. I will go ahead and order.

## 2014-10-29 ENCOUNTER — Ambulatory Visit (INDEPENDENT_AMBULATORY_CARE_PROVIDER_SITE_OTHER): Payer: 59 | Admitting: Family Medicine

## 2014-10-29 ENCOUNTER — Encounter: Payer: Self-pay | Admitting: Family Medicine

## 2014-10-29 VITALS — BP 120/80 | HR 65 | Resp 16 | Ht 64.0 in | Wt 161.2 lb

## 2014-10-29 DIAGNOSIS — M47812 Spondylosis without myelopathy or radiculopathy, cervical region: Secondary | ICD-10-CM | POA: Diagnosis not present

## 2014-10-29 DIAGNOSIS — F329 Major depressive disorder, single episode, unspecified: Secondary | ICD-10-CM

## 2014-10-29 DIAGNOSIS — M542 Cervicalgia: Secondary | ICD-10-CM | POA: Diagnosis not present

## 2014-10-29 DIAGNOSIS — M549 Dorsalgia, unspecified: Secondary | ICD-10-CM | POA: Diagnosis not present

## 2014-10-29 DIAGNOSIS — F32A Depression, unspecified: Secondary | ICD-10-CM

## 2014-10-29 DIAGNOSIS — R110 Nausea: Secondary | ICD-10-CM

## 2014-10-29 DIAGNOSIS — G5602 Carpal tunnel syndrome, left upper limb: Secondary | ICD-10-CM

## 2014-10-29 DIAGNOSIS — G8929 Other chronic pain: Secondary | ICD-10-CM

## 2014-10-29 DIAGNOSIS — G5601 Carpal tunnel syndrome, right upper limb: Secondary | ICD-10-CM | POA: Diagnosis not present

## 2014-10-29 DIAGNOSIS — G5603 Carpal tunnel syndrome, bilateral upper limbs: Secondary | ICD-10-CM

## 2014-10-29 MED ORDER — PROMETHAZINE HCL 25 MG PO TABS: 25.0000 mg | ORAL_TABLET | Freq: Four times a day (QID) | ORAL | Status: AC | PRN

## 2014-10-29 NOTE — Assessment & Plan Note (Signed)
Continue etodolac and follow-up with orthopedics.

## 2014-10-29 NOTE — Assessment & Plan Note (Signed)
Appt at spine center for evaluation. Gabapentin for peripheral neuropathy. Alarm symptoms reviewed.

## 2014-10-29 NOTE — Progress Notes (Addendum)
Subjective:    Patient ID: Michelle Boyle, female    DOB: 14-Jan-1956, 59 y.o.   MRN: [id number]  HPI: Michelle Boyle is a 59 y.o. female presenting on 10/29/2014 for Peripheral Neuropathy  Pt presents for follow-up of degenerative disc disease. Pt will see spine center and ortho on August 9. Pt was started on gabapentin for peripheral neuropathy by podiatry. It has helped some. Pt is experiencing nausea from her medication. Take 2 gabapentin per day. She has tried amitryptline She is having increasing trouble with numbness in both hands which is affecting her fine motor skills. She also is unable to work without taking multiple breaks due to peripheral neuropathy in both legs. She is currently working as a CMA for 5 hours only. She is unable to take all prescribed pain medications due to drowsiness and fear it will interfere with her work. She is requesting that disability paperwork be completed today.  Carpal tunnel: takes etodolac PRN for pain.   She is not sure she will be able to see chronic pain due to cost. Since she is scheduled to   HPI   Past Medical History  Diagnosis Date  . Back pain   . Palpitations   . Allergy   . Arthritis   . Carpal tunnel syndrome   . Bunion     Current Outpatient Prescriptions on File Prior to Visit  Medication Sig  . albuterol (VENTOLIN HFA) 108 (90 BASE) MCG/ACT inhaler Inhale into the lungs.  Marland Kitchen atenolol-chlorthalidone (TENORETIC) 50-25 MG per tablet Take 1 tablet by mouth daily.  Marland Kitchen atorvastatin (LIPITOR) 20 MG tablet Take 1 tablet (20 mg total) by mouth daily.  Marland Kitchen etodolac (LODINE) 200 MG capsule Take 2 capsules (400 mg total) by mouth 3 (three) times daily.  . fluticasone (FLONASE) 50 MCG/ACT nasal spray 1-2 sprays in each nostril qday as needed  . gabapentin (NEURONTIN) 100 MG capsule Take 1 capsule (100 mg total) by mouth 3 (three) times daily.  . Multiple Vitamin (MULTI-VITAMINS) TABS Take by mouth.  . sertraline (ZOLOFT) 100 MG tablet Take 100  mg by mouth daily.  . cetirizine (ZYRTEC) 10 MG tablet Take 10 mg by mouth.  . oxyCODONE-acetaminophen (ROXICET) 5-325 MG per tablet Take 0.5 tablets by mouth every 8 (eight) hours as needed for severe pain. (Patient not taking: Reported on 10/29/2014)   No current facility-administered medications on file prior to visit.    Review of Systems  Constitutional: Negative for fever and chills.  HENT: Negative.   Respiratory: Negative for chest tightness, shortness of breath and wheezing.   Cardiovascular: Negative for chest pain, palpitations and leg swelling.  Gastrointestinal: Positive for nausea. Negative for abdominal pain, diarrhea and constipation.  Genitourinary: Negative.   Musculoskeletal: Positive for back pain and neck pain.  Neurological: Positive for weakness and numbness. Negative for dizziness, light-headedness and headaches.  Psychiatric/Behavioral: Positive for dysphoric mood.   Per HPI unless specifically indicated above     Objective:    BP 120/80 mmHg  Pulse 65  Resp 16  Ht 5\' 4"  (1.626 m)  Wt 161 lb 3.2 oz (73.12 kg)  BMI 27.66 kg/m2  LMP  (LMP Unknown)  Wt Readings from Last 3 Encounters:  10/29/14 161 lb 3.2 oz (73.12 kg)  10/11/14 158 lb 12.8 oz (72.031 kg)  10/04/14 155 lb (70.308 kg)    Physical Exam  Constitutional: She is oriented to person, place, and time. She appears well-developed and well-nourished.  HENT:  Head: Normocephalic and atraumatic.  Neck: Neck supple.  Cardiovascular: Normal rate, regular rhythm and normal heart sounds.  Exam reveals no gallop and no friction rub.   No murmur heard. Pulmonary/Chest: Effort normal and breath sounds normal. She has no wheezes. She exhibits no tenderness.  Abdominal: Soft. Normal appearance and bowel sounds are normal. She exhibits no distension and no mass. There is no tenderness. There is no rebound and no guarding.  Musculoskeletal: Normal range of motion. She exhibits no edema or tenderness.   Lymphadenopathy:    She has no cervical adenopathy.  Neurological: She is alert and oriented to person, place, and time. A sensory deficit (paresthesia bilateral hands.) is present. No cranial nerve deficit.  Skin: Skin is warm and dry.   Results for orders placed or performed in visit on 10/11/14  Lipid Profile  Result Value Ref Range   Cholesterol, Total 280 (H) 100 - 199 mg/dL   Triglycerides 161162 (H) 0 - 149 mg/dL   HDL 44 >09>39 mg/dL   LDL Calculated 604204 (H) 0 - 99 mg/dL   Comment: Comment    Chol/HDL Ratio 6.4 (H) 0.0 - 4.4 ratio units  CBC with Differential  Result Value Ref Range   WBC 6.3 3.4 - 10.8 x10E3/uL   RBC 4.54 3.77 - 5.28 x10E6/uL   Hemoglobin 14.7 11.1 - 15.9 g/dL   Hematocrit 54.041.2 98.134.0 - 46.6 %   MCV 91 79 - 97 fL   MCH 32.4 26.6 - 33.0 pg   MCHC 35.7 31.5 - 35.7 g/dL   RDW 19.113.6 47.812.3 - 29.515.4 %   Platelets 269 150 - 379 x10E3/uL   NEUTROPHILS 51 %   Lymphs 35 %   Monocytes 10 %   Eos 4 %   Basos 0 %   Neutrophils Absolute 3.2 1.4 - 7.0 x10E3/uL   Lymphocytes Absolute 2.2 0.7 - 3.1 x10E3/uL   Monocytes Absolute 0.6 0.1 - 0.9 x10E3/uL   EOS (ABSOLUTE) 0.3 0.0 - 0.4 x10E3/uL   Basophils Absolute 0.0 0.0 - 0.2 x10E3/uL   Immature Granulocytes 0 %   Immature Grans (Abs) 0.0 0.0 - 0.1 x10E3/uL  Vitamin B12  Result Value Ref Range   Vitamin B-12 503 211 - 946 pg/mL  Vit D  25 hydroxy (rtn osteoporosis monitoring)  Result Value Ref Range   Vit D, 25-Hydroxy 30.2 30.0 - 100.0 ng/mL      Assessment & Plan:   Problem List Items Addressed This Visit      Nervous and Auditory   Carpal tunnel syndrome, bilateral    Continue etodolac and follow-up with orthopedics.         Musculoskeletal and Integument   Cervical osteoarthritis - Primary    Appt at spine center for evaluation. Gabapentin for peripheral neuropathy. Alarm symptoms reviewed.          Other   Chronic neck and back pain   Depression    Other Visit Diagnoses    Nausea        Likely  medication reaction vs acid reflux. Pt advised to trial tums PRN.  Phenergan renewed.     Relevant Medications    promethazine (PHENERGAN) 25 MG tablet       Meds ordered this encounter  Medications  . DISCONTD: promethazine (PHENERGAN) 25 MG tablet    Sig: Take 25 mg by mouth every 6 (six) hours as needed for nausea or vomiting.  . promethazine (PHENERGAN) 25 MG tablet    Sig: Take 1 tablet (25 mg total) by mouth  every 6 (six) hours as needed for nausea or vomiting.    Dispense:  30 tablet    Refill:  0    Order Specific Question:  Supervising Provider    Answer:  Janeann Forehand [[id number]]      Follow up plan: Return if symptoms worsen or fail to improve.

## 2014-10-29 NOTE — Patient Instructions (Addendum)
Chronic Pain: Continue to keep [facility]. We cancel your referral to chronic pain. Try 2 gabapentin at night and 1 in the AM. You can eventually increase up to 2 in the AM and 2 in the PM.  Nausea: Try tums for potential acid reflux. You can also try OTC zantac.  I have renewed your phenergan as needed.

## 2014-11-01 ENCOUNTER — Telehealth: Payer: Self-pay | Admitting: Family Medicine

## 2014-11-01 NOTE — Telephone Encounter (Signed)
Advised pt to call specialist office that she has been seen on 07/2014 to do appeal since we can't do [facility] retro [facility]

## 2014-11-01 NOTE — Telephone Encounter (Signed)
Pt [facility] to [facility] and has been there a few times starting April 6th.  They just realized she has [employer] and they require authorization.  Please do referral to ins to see if they will go that far back.  Her call back number is [phone] and Dr. Geryl RankinsHyatt's npi is [phone].

## 2014-11-02 ENCOUNTER — Telehealth: Payer: Self-pay | Admitting: Family Medicine

## 2014-11-02 NOTE — Telephone Encounter (Signed)
Called pt regarding disability paperwork. It is complete but the paper is requesting office notes. We will need her to sign a medical release allowing us to do so.  When she picks up the paperwork she can sign this release.

## 2014-11-03 ENCOUNTER — Telehealth: Payer: Self-pay | Admitting: Family Medicine

## 2014-11-03 NOTE — Telephone Encounter (Signed)
Spoke to Tammy from [facility]  they want us to back date or retro approval for pt's visit from April and may and June 22 nd but advised her that unfortunately insurance doesn't approve for retro but we can do future approval if she sets up any appointment either pt or specialist office has to let us know before seeing pt for approval but after visit it's hard to approve by PCP unless Specialist can do appeal that way Insurance can pay Tammy understand well so explain all these to pt too in previous call.

## 2014-11-18 ENCOUNTER — Telehealth: Payer: Self-pay | Admitting: Family Medicine

## 2014-11-18 NOTE — Telephone Encounter (Signed)
Pt left message yesterday late afternoon  and spoke to pt today she wants Rx for Vicodin but already talk to Amy which she won't Rx percocet and advised pt that these all are narcotic and Amy doesn't do chronic pain and that's why we refer her to Spine center but Amy will be happy to discuss if pt is willing to schedule an appointment but pt refused to schedule an appointment with Amy ?

## 2014-11-30 ENCOUNTER — Telehealth: Payer: Self-pay | Admitting: Family Medicine

## 2014-11-30 DIAGNOSIS — Z9889 Other specified postprocedural states: Secondary | ICD-10-CM | POA: Insufficient documentation

## 2014-11-30 DIAGNOSIS — Z981 Arthrodesis status: Secondary | ICD-10-CM | POA: Insufficient documentation

## 2014-11-30 NOTE — Telephone Encounter (Signed)
Pt have appt in morning at Neuro  Surgeon /spine  [address] Call back # is [phone] ----- Dr Madelin Headings ----NPI  [id number] ------M54.40------ APPT AUG 10th.

## 2014-11-30 NOTE — Telephone Encounter (Signed)
Done united [facility] referral number is [id number].

## 2014-12-16 ENCOUNTER — Telehealth: Payer: Self-pay | Admitting: Family Medicine

## 2014-12-16 NOTE — Telephone Encounter (Signed)
Please suggest? 

## 2014-12-16 NOTE — Telephone Encounter (Signed)
Pt said the gabapentin was making her sick.  Please call [phone]

## 2014-12-16 NOTE — Telephone Encounter (Signed)
I am not the prescriber for this medication. It looks like podiatry is managing this medication. I would suggest giving them a call to discuss side effects as they would like to know if she stops taking the medication and can help her find another that might work better for the problem they are treating.  General tips for nausea with medication include taking with food or taking at bedtime. Thanks! AK

## 2014-12-17 NOTE — Telephone Encounter (Signed)
Try to advise pt that it came from Podiatry and not from Amy but her reply that she feels bad and will make appointment.

## 2015-01-03 ENCOUNTER — Ambulatory Visit (INDEPENDENT_AMBULATORY_CARE_PROVIDER_SITE_OTHER): Payer: 59 | Admitting: Family Medicine

## 2015-01-03 ENCOUNTER — Encounter: Payer: Self-pay | Admitting: Family Medicine

## 2015-01-03 ENCOUNTER — Other Ambulatory Visit: Payer: Self-pay | Admitting: Family Medicine

## 2015-01-03 VITALS — BP 111/78 | HR 81 | Temp 98.0°F | Resp 16 | Ht 64.0 in | Wt 162.2 lb

## 2015-01-03 DIAGNOSIS — F329 Major depressive disorder, single episode, unspecified: Secondary | ICD-10-CM | POA: Diagnosis not present

## 2015-01-03 DIAGNOSIS — G8929 Other chronic pain: Secondary | ICD-10-CM

## 2015-01-03 DIAGNOSIS — H8110 Benign paroxysmal vertigo, unspecified ear: Secondary | ICD-10-CM | POA: Diagnosis not present

## 2015-01-03 DIAGNOSIS — J309 Allergic rhinitis, unspecified: Secondary | ICD-10-CM

## 2015-01-03 DIAGNOSIS — F32A Depression, unspecified: Secondary | ICD-10-CM

## 2015-01-03 DIAGNOSIS — M549 Dorsalgia, unspecified: Secondary | ICD-10-CM | POA: Diagnosis not present

## 2015-01-03 DIAGNOSIS — M542 Cervicalgia: Secondary | ICD-10-CM

## 2015-01-03 DIAGNOSIS — E785 Hyperlipidemia, unspecified: Secondary | ICD-10-CM

## 2015-01-03 MED ORDER — ETODOLAC 200 MG PO CAPS: 400.0000 mg | ORAL_CAPSULE | Freq: Three times a day (TID) | ORAL | Status: AC

## 2015-01-03 MED ORDER — CETIRIZINE HCL 10 MG PO TABS: 10.0000 mg | ORAL_TABLET | Freq: Every day | ORAL | Status: AC

## 2015-01-03 NOTE — Patient Instructions (Signed)
Epley Maneuver Self-Care WHAT IS THE EPLEY MANEUVER? The Epley maneuver is an exercise you can do to relieve symptoms of benign paroxysmal positional vertigo (BPPV). This condition is often just referred to as vertigo. BPPV is caused by the movement of tiny crystals (canaliths) inside your inner ear. The accumulation and movement of canaliths in your inner ear causes a sudden spinning sensation (vertigo) when you move your head to certain positions. Vertigo usually lasts about 30 seconds. BPPV usually occurs in just one ear. If you get vertigo when you lie on your left side, you probably have BPPV in your left ear. Your health care provider can tell you which ear is involved.  BPPV may be caused by a head injury. Many people older than 50 get BPPV for unknown reasons. If you have been diagnosed with BPPV, your health care provider may teach you how to do this maneuver. BPPV is not life threatening (benign) and usually goes away in time.  WHEN SHOULD I PERFORM THE EPLEY MANEUVER? You can do this maneuver at home whenever you have symptoms of vertigo. You may do the Epley maneuver up to 3 times a day until your symptoms of vertigo go away. HOW SHOULD I DO THE EPLEY MANEUVER? 1. Sit on the edge of a bed or table with your back straight. Your legs should be extended or hanging over the edge of the bed or table.  2. Turn your head halfway toward the affected ear.  3. Lie backward quickly with your head turned until you are lying flat on your back. You may want to position a pillow under your shoulders.  4. Hold this position for 30 seconds. You may experience an attack of vertigo. This is normal. Hold this position until the vertigo stops. 5. Then turn your head to the opposite direction until your unaffected ear is facing the floor.  6. Hold this position for 30 seconds. You may experience an attack of vertigo. This is normal. Hold this position until the vertigo stops. 7. Now turn your whole body to  the same side as your head. Hold for another 30 seconds.  8. You can then sit back up. ARE THERE RISKS TO THIS MANEUVER? In some cases, you may have other symptoms (such as changes in your vision, weakness, or numbness). If you have these symptoms, stop doing the maneuver and call your health care provider. Even if doing these maneuvers relieves your vertigo, you may still have dizziness. Dizziness is the sensation of light-headedness but without the sensation of movement. Even though the Epley maneuver may relieve your vertigo, it is possible that your symptoms will return within 5 years. WHAT SHOULD I DO AFTER THIS MANEUVER? After doing the Epley maneuver, you can return to your normal activities. Ask your doctor if there is anything you should do at home to prevent vertigo. This may include:  Sleeping with two or more pillows to keep your head elevated.  Not sleeping on the side of your affected ear.  Getting up slowly from bed.  Avoiding sudden movements during the day.  Avoiding extreme head movement, like looking up or bending over.  Wearing a cervical collar to prevent sudden head movements. WHAT SHOULD I DO IF MY SYMPTOMS GET WORSE? Call your health care provider if your vertigo gets worse. Call your provider right way if you have other symptoms, including:   Nausea.  Vomiting.  Headache.  Weakness.  Numbness.  Vision changes. Document Released: 04/14/2013 Document Reviewed: 04/14/2013 ExitCare   Patient Information 2015 [employer]. This information is not intended to replace advice given to you by your health care provider. Make sure you discuss any questions you have with your health care provider.  

## 2015-01-03 NOTE — Progress Notes (Signed)
Date:  01/03/2015   Name:  Michelle Boyle   DOB:  [birth year]   MRN:  [id number]  PCP:  Leata Mouse, NP    Chief Complaint: Dizziness   History of Present Illness:  This is a 59 y.o. female with MMP including chronic pain reports vertigo and nausea over past month, seen UC, no etiology found, referred to [facility] neurology, has appt 01/26/15. Onset gradual, worse with head movement, some B ear ringing, promethazine helps. Lipitor started 2.5 months ago (no lipids since), only new med, has switched from Zyrtec to [facility] for allergies lately. Also c/o malaise/fatigue and chronic pain, not currently on opioids, gabapentin made worse.  Review of Systems:  Review of Systems  Constitutional: Negative for fever and chills.  HENT: Positive for postnasal drip, rhinorrhea, sneezing and tinnitus. Negative for dental problem, ear pain, facial swelling, hearing loss, sore throat and trouble swallowing.     Patient Active Problem List   Diagnosis Date Noted  . H/O arthrodesis 11/30/2014  . S/P lumbar spine operation 11/30/2014  . Hyperlipidemia 10/13/2014  . Chronic neck and back pain 10/11/2014  . Carpal tunnel syndrome, bilateral 10/11/2014  . Depression 10/11/2014  . Allergic rhinitis 12/15/2012  . Generalized OA 05/30/2012  . Awareness of heartbeats 05/30/2012  . Back ache 07/31/2010  . Cervical osteoarthritis 07/31/2010  . Calculus of kidney 06/29/2010  . 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 Compulsive tobacco user syndrome 06/29/2010  . Diverticulitis of colon 08/25/2003    Prior to Admission medications   Medication Sig Start Date End Date Taking? Authorizing Provider  albuterol (VENTOLIN HFA) 108 (90 BASE) MCG/ACT inhaler Inhale into the lungs. 02/22/14  Yes Historical Provider, MD  atenolol-chlorthalidone (TENORETIC) 50-25 MG per tablet Take 0.5 tablets by mouth daily.   Yes Historical Provider, MD  atorvastatin (LIPITOR) 20 MG tablet Take 1 tablet (20 mg total) by mouth daily. 10/13/14   Yes Amy Overton Mam, NP  etodolac (LODINE) 200 MG capsule Take 2 capsules (400 mg total) by mouth 3 (three) times daily. 01/03/15  Yes Arlis Porta., MD  fluticasone Asencion Islam) 50 MCG/ACT nasal spray 1-2 sprays in each nostril qday as needed 11/19/12  Yes Historical Provider, MD  Multiple Vitamin (MULTI-VITAMINS) TABS Take by mouth. 01/03/10  Yes Historical Provider, MD  oxyCODONE-acetaminophen (ROXICET) 5-325 MG per tablet Take 0.5 tablets by mouth every 8 (eight) hours as needed for severe pain. 10/11/14  Yes Amy Overton Mam, NP  promethazine (PHENERGAN) 25 MG tablet Take 1 tablet (25 mg total) by mouth every 6 (six) hours as needed for nausea or vomiting. 10/29/14  Yes Amy Overton Mam, NP  sertraline (ZOLOFT) 100 MG tablet Take 100 mg by mouth daily.   Yes Historical Provider, MD  cetirizine (ZYRTEC) 10 MG tablet Take 1 tablet (10 mg total) by mouth daily. 01/03/15   Adline Potter, MD    Allergies  Allergen Reactions  . Codeine     VOMITING  . Levaquin [Levofloxacin In D5w]   . Sulfa Antibiotics     "I can't remember how I am allergic"  . Gabapentin     Sedation  . Levofloxacin     Rash  . Tramadol     Sedation    Past Surgical History  Procedure Laterality Date  . Back surgery    . Abdominal hysterectomy    . Cholecystectomy    . Back surgery  2005    2 back 1 fusion  . Neck surgery  2005  neck fusion  . Tubal ligation  1980  . Gum surgery  1990    Social History  Substance Use Topics  . Smoking status: Current Every Day Smoker -- 0.50 packs/day    Types: Cigarettes  . Smokeless tobacco: Never Used  . Alcohol Use: No    Family History  Problem Relation Age of Onset  . Heart disease Father   . Heart disease Sister   . Cancer Sister   . Cancer Brother     pencreatic and bladder  . Cancer Maternal Grandmother   . Cancer Paternal Grandmother     Medication list has been reviewed and updated.  Physical Examination: BP 111/78 mmHg  Pulse 81  Temp(Src) 98  F (36.7 C) (Oral)  Resp 16  Ht 5' 4"  (1.626 m)  Wt 162 lb 3.2 oz (73.573 kg)  BMI 27.83 kg/m2  Physical Exam  Constitutional: She is oriented to person, place, and time. She appears well-developed and well-nourished. No distress.  HENT:  Head: Normocephalic and atraumatic.  Right Ear: External ear normal.  Left Ear: External ear normal.  Nose: Nose normal.  Mouth/Throat: Oropharynx is clear and moist.  Eyes: Conjunctivae and EOM are normal. Pupils are equal, round, and reactive to light.  No nystagmus  Neck: Normal range of motion. Neck supple. No thyromegaly present.  Cardiovascular: Normal rate, regular rhythm and normal heart sounds.   Pulmonary/Chest: Effort normal and breath sounds normal.  Musculoskeletal: She exhibits no edema.  Lymphadenopathy:    She has no cervical adenopathy.  Neurological: She is alert and oriented to person, place, and time.  Positive Hallpike  Skin: Skin is warm and dry. She is not diaphoretic.  Psychiatric: Her behavior is normal.  Flat affect  Nursing note and vitals reviewed.   Assessment and Plan:  1. BPV (benign positional vertigo), unspecified laterality Epley instructions given, recommend keep neurology appt for further eval  2. Hyperlipidemia On Lipitor x 2.5 months, doubt contributing to current sxs - Comprehensive metabolic panel - Lipid Profile  3. Chronic neck and back pain Neurology eval may help define, may need pain clinic referral - Sed Rate (ESR)  4. Depression Continue current regimen - TSH  5. Allergic rhinitis, unspecified allergic rhinitis type D/c Benedryl as may be contributing to dizziness, rx for Zyrtec given   Return if symptoms worsen or fail to improve.  Satira Anis. [facility]  01/03/2015

## 2015-01-04 LAB — SEDIMENTATION RATE: Sed Rate: 2 mm/hr (ref 0–40)

## 2015-01-05 LAB — COMPREHENSIVE METABOLIC PANEL
ALT: 21 IU/L (ref 0–32)
AST: 19 IU/L (ref 0–40)
Albumin/Globulin Ratio: 2 (ref 1.1–2.5)
Albumin: 4.7 g/dL (ref 3.5–5.5)
Alkaline Phosphatase: 65 IU/L (ref 39–117)
BUN/Creatinine Ratio: 18 (ref 9–23)
BUN: 14 mg/dL (ref 6–24)
Bilirubin Total: 0.6 mg/dL (ref 0.0–1.2)
CALCIUM: 9.6 mg/dL (ref 8.7–10.2)
CO2: 24 mmol/L (ref 18–29)
Chloride: 101 mmol/L (ref 97–108)
Creatinine, Ser: 0.79 mg/dL (ref 0.57–1.00)
GFR calc Af Amer: 95 mL/min/{1.73_m2} (ref >59)
GFR, EST NON AFRICAN AMERICAN: 83 mL/min/{1.73_m2} (ref >59)
GLOBULIN, TOTAL: 2.4 g/dL (ref 1.5–4.5)
Glucose: 84 mg/dL (ref 65–99)
Potassium: 4.7 mmol/L (ref 3.5–5.2)
SODIUM: 142 mmol/L (ref 134–144)
Total Protein: 7.1 g/dL (ref 6.0–8.5)

## 2015-01-05 LAB — LIPID PANEL
Chol/HDL Ratio: 4.6 ratio units — ABNORMAL HIGH (ref 0.0–4.4)
Cholesterol, Total: 179 mg/dL (ref 100–199)
HDL: 39 mg/dL — AB (ref >39)
LDL CALC: 111 mg/dL — AB (ref 0–99)
TRIGLYCERIDES: 143 mg/dL (ref 0–149)
VLDL Cholesterol Cal: 29 mg/dL (ref 5–40)

## 2015-01-05 LAB — TSH: TSH: 1.9 u[IU]/mL (ref 0.450–4.500)

## 2015-01-06 ENCOUNTER — Other Ambulatory Visit: Payer: Self-pay

## 2015-01-06 ENCOUNTER — Other Ambulatory Visit: Payer: Self-pay | Admitting: Family Medicine

## 2015-01-06 MED ORDER — ATORVASTATIN CALCIUM 40 MG PO TABS: 40.0000 mg | ORAL_TABLET | Freq: Every day | ORAL | Status: DC

## 2015-01-06 NOTE — Addendum Note (Signed)
Addended by: Schuyler Amor on: 01/06/2015 08:45 AM   Modules accepted: Orders

## 2015-01-26 ENCOUNTER — Telehealth: Payer: Self-pay

## 2015-01-26 NOTE — Telephone Encounter (Signed)
Michelle Boyle called to get approval from her insurance for today's visit from [facility] and referral submission number is [id number].

## 2015-02-11 DIAGNOSIS — M545 Low back pain, unspecified: Secondary | ICD-10-CM | POA: Insufficient documentation

## 2015-02-11 DIAGNOSIS — G8929 Other chronic pain: Secondary | ICD-10-CM | POA: Insufficient documentation

## 2015-02-11 DIAGNOSIS — M542 Cervicalgia: Secondary | ICD-10-CM

## 2015-02-22 ENCOUNTER — Encounter: Payer: Self-pay | Admitting: Family Medicine

## 2015-03-01 ENCOUNTER — Telehealth: Payer: Self-pay | Admitting: Family Medicine

## 2015-03-01 NOTE — Telephone Encounter (Signed)
Dermatology in [place] called  Michelle Boyle [phone] states that pt in office today needing  A referral   Dx: l57.0,L802.0 Dr. Reed BreechPatricia  Ma[place] , [address]

## 2015-03-01 NOTE — Telephone Encounter (Signed)
Called Tessa back to get more info on way referral needed.Michelle Boyle

## 2015-03-02 NOTE — Telephone Encounter (Signed)
[facility]. Referral # [id number] Valid 03/01/2015-08/29/2015.

## 2015-03-15 ENCOUNTER — Encounter: Payer: Self-pay | Admitting: *Deleted

## 2015-03-15 ENCOUNTER — Emergency Department: Payer: 59

## 2015-03-15 ENCOUNTER — Emergency Department
Admission: EM | Admit: 2015-03-15 | Discharge: 2015-03-15 | Disposition: A | Discharge status: Home / Self Care | Payer: 59 | Attending: Emergency Medicine | Admitting: Emergency Medicine

## 2015-03-15 DIAGNOSIS — J069 Acute upper respiratory infection, unspecified: Secondary | ICD-10-CM | POA: Insufficient documentation

## 2015-03-15 DIAGNOSIS — F419 Anxiety disorder, unspecified: Secondary | ICD-10-CM | POA: Diagnosis not present

## 2015-03-15 DIAGNOSIS — F17210 Nicotine dependence, cigarettes, uncomplicated: Secondary | ICD-10-CM | POA: Diagnosis not present

## 2015-03-15 DIAGNOSIS — Z79899 Other long term (current) drug therapy: Secondary | ICD-10-CM | POA: Diagnosis not present

## 2015-03-15 DIAGNOSIS — J9801 Acute bronchospasm: Secondary | ICD-10-CM | POA: Diagnosis not present

## 2015-03-15 DIAGNOSIS — R0789 Other chest pain: Secondary | ICD-10-CM | POA: Diagnosis present

## 2015-03-15 LAB — CBC
HCT: 43 % (ref 35.0–47.0)
Hemoglobin: 14.3 g/dL (ref 12.0–16.0)
MCH: 31.5 pg (ref 26.0–34.0)
MCHC: 33.4 g/dL (ref 32.0–36.0)
MCV: 94.3 fL (ref 80.0–100.0)
PLATELETS: 210 10*3/uL (ref 150–440)
RBC: 4.56 MIL/uL (ref 3.80–5.20)
RDW: 12.6 % (ref 11.5–14.5)
WBC: 7.6 10*3/uL (ref 3.6–11.0)

## 2015-03-15 LAB — BASIC METABOLIC PANEL
Anion gap: 8 (ref 5–15)
BUN: 15 mg/dL (ref 6–20)
CALCIUM: 9.6 mg/dL (ref 8.9–10.3)
CO2: 25 mmol/L (ref 22–32)
CREATININE: 0.72 mg/dL (ref 0.44–1.00)
Chloride: 103 mmol/L (ref 101–111)
GFR calc Af Amer: >60 mL/min (ref >60)
GFR calc non Af Amer: >60 mL/min (ref >60)
Glucose, Bld: 104 mg/dL — ABNORMAL HIGH (ref 65–99)
Potassium: 3.8 mmol/L (ref 3.5–5.1)
SODIUM: 136 mmol/L (ref 135–145)

## 2015-03-15 LAB — TROPONIN I

## 2015-03-15 MED ORDER — PREDNISONE 50 MG PO TABS: 50.0000 mg | ORAL_TABLET | Freq: Every day | ORAL | Status: DC

## 2015-03-15 MED ORDER — METHYLPREDNISOLONE SODIUM SUCC 125 MG IJ SOLR
125.0000 mg | Freq: Once | INTRAMUSCULAR | Status: AC
  Administered 2015-03-15: 125 mg via INTRAVENOUS
  Filled 2015-03-15: qty 2

## 2015-03-15 MED ORDER — IPRATROPIUM-ALBUTEROL 0.5-2.5 (3) MG/3ML IN SOLN
3.0000 mL | Freq: Once | RESPIRATORY_TRACT | Status: AC
Start: 2015-03-15 — End: 2015-03-15
  Administered 2015-03-15: 3 mL via RESPIRATORY_TRACT
  Filled 2015-03-15: qty 3

## 2015-03-15 NOTE — ED Notes (Signed)
Patient transported to X-ray 

## 2015-03-15 NOTE — ED Notes (Signed)
Dr Kinner at bedside. 

## 2015-03-15 NOTE — ED Provider Notes (Signed)
[facility] Emergency Department Provider Note  ____________________________________________  Time seen: On arrival  I have reviewed the triage vital signs and the nursing notes.   HISTORY  Chief Complaint Chest Pain; Shortness of Breath; and Cough    HPI Michelle Boyle is a 59 y.o. female who presents with 2 weeks of productive cough and mild shortness of breath that has been worsening and chest discomfort which she describes as similar to having a chest cold. She denies fevers but has had chills. No pleurisy. No recent travel. No calf pain. She does smoke cigarettes.     Past Medical History  Diagnosis Date  . Back pain   . Palpitations   . Allergy   . Arthritis   . Carpal tunnel syndrome   . Bunion     Patient Active Problem List   Diagnosis Date Noted  . H/O arthrodesis 11/30/2014  . S/P lumbar spine operation 11/30/2014  . Hyperlipidemia 10/13/2014  . Chronic neck and back pain 10/11/2014  . Carpal tunnel syndrome, bilateral 10/11/2014  . Depression 10/11/2014  . Allergic rhinitis 12/15/2012  . Generalized OA 05/30/2012  . Awareness of heartbeats 05/30/2012  . Back ache 07/31/2010  . Cervical osteoarthritis 07/31/2010  . Calculus of kidney 06/29/2010  . 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 Compulsive tobacco user syndrome 06/29/2010  . Diverticulitis of colon 08/25/2003    Past Surgical History  Procedure Laterality Date  . Back surgery    . Abdominal hysterectomy    . Cholecystectomy    . Back surgery  2005    2 back 1 fusion  . Neck surgery  2005    neck fusion  . Tubal ligation  1980  . Gum surgery  1990    Current Outpatient Rx  Name  Route  Sig  Dispense  Refill  . albuterol (VENTOLIN HFA) 108 (90 BASE) MCG/ACT inhaler   Inhalation   Inhale into the lungs.         Marland Kitchen. atenolol-chlorthalidone (TENORETIC) 50-25 MG per tablet   Oral   Take 0.5 tablets by mouth daily.         Marland Kitchen. atorvastatin (LIPITOR) 40 MG tablet    Oral   Take 1 tablet (40 mg total) by mouth daily.   30 tablet   2   . cetirizine (ZYRTEC) 10 MG tablet   Oral   Take 1 tablet (10 mg total) by mouth daily.   30 tablet   2   . etodolac (LODINE) 200 MG capsule   Oral   Take 2 capsules (400 mg total) by mouth 3 (three) times daily.   84 capsule   3   . fluticasone (FLONASE) 50 MCG/ACT nasal spray      1-2 sprays in each nostril qday as needed         . Multiple Vitamin (MULTI-VITAMINS) TABS   Oral   Take by mouth.         . oxyCODONE-acetaminophen (ROXICET) 5-325 MG per tablet   Oral   Take 0.5 tablets by mouth every 8 (eight) hours as needed for severe pain.   10 tablet   0   . promethazine (PHENERGAN) 25 MG tablet   Oral   Take 1 tablet (25 mg total) by mouth every 6 (six) hours as needed for nausea or vomiting.   30 tablet   0   . sertraline (ZOLOFT) 100 MG tablet   Oral   Take 100 mg by mouth daily.  Allergies Codeine; Levaquin; Sulfa antibiotics; Gabapentin; Levofloxacin; and Tramadol  Family History  Problem Relation Age of Onset  . Heart disease Father   . Heart disease Sister   . Cancer Sister   . Cancer Brother     pencreatic and bladder  . Cancer Maternal Grandmother   . Cancer Paternal Grandmother     Social History Social History  Substance Use Topics  . Smoking status: Current Every Day Smoker -- 0.50 packs/day    Types: Cigarettes  . Smokeless tobacco: Never Used  . Alcohol Use: No    Review of Systems  Constitutional: Negative for fever. Eyes: Negative for visual changes. ENT: Negative for sore throat Cardiovascular: Negative for chest pain. Respiratory: Positive for shortness of breath and cough Gastrointestinal: Negative for abdominal pain, vomiting and diarrhea. Genitourinary: Negative for dysuria. Musculoskeletal: Negative for back pain. Skin: Negative for rash. Neurological: Negative for headaches or focal weakness Psychiatric:  Anxiety    ____________________________________________   PHYSICAL EXAM:  VITAL SIGNS: ED Triage Vitals  Enc Vitals Group     BP 03/15/15 1222 127/86 mmHg     Pulse Rate 03/15/15 1222 59     Resp 03/15/15 1222 21     Temp 03/15/15 1222 97.3 F (36.3 C)     Temp Source 03/15/15 1222 Oral     SpO2 03/15/15 1222 100 %     Weight 03/15/15 1222 164 lb (74.39 kg)     Height 03/15/15 1222  (1.626 m)     Head Cir --      Peak Flow --      Pain Score 03/15/15 1224 10     Pain Loc --      Pain Edu? --      Excl. in GC? --      Constitutional: Alert and oriented. Well appearing and in no distress. Eyes: Conjunctivae are normal.  ENT   Head: Normocephalic and atraumatic.   Mouth/Throat: Mucous membranes are moist. Cardiovascular: Normal rate, regular rhythm. Normal and symmetric distal pulses are present in all extremities. No murmurs, rubs, or gallops. Respiratory: Normal respiratory effort without tachypnea nor retractions. Scattered wheezes Gastrointestinal: Soft and non-tender in all quadrants. No distention. There is no CVA tenderness. Genitourinary: deferred Musculoskeletal: Nontender with normal range of motion in all extremities. No lower extremity tenderness nor edema. Neurologic:  Normal speech and language. No gross focal neurologic deficits are appreciated. Skin:  Skin is warm, dry and intact. No rash noted. Psychiatric: Mood and affect are normal. Patient exhibits appropriate insight and judgment.  ____________________________________________    LABS (pertinent positives/negatives)  Labs Reviewed  BASIC METABOLIC PANEL - Abnormal; Notable for the following:    Glucose, Bld 104 (*)    All other components within normal limits  CBC  TROPONIN I    ____________________________________________   EKG  *ED ECG REPORT I, Jene Every, the attending physician, personally viewed and interpreted this ECG.  Date: 03/15/2015 EKG Time: 12:21  PM  Rhythm: normal sinus rhythm QRS Axis: normal Intervals: normal ST/T Wave abnormalities: normal Conduction Disutrbances: none Narrative Interpretation: unremarkable   ____________________________________________    RADIOLOGY I have personally reviewed any xrays that were ordered on this patient: Chest x-ray with possible nodule  CT scan shows no abnormalities or nodule  ____________________________________________   PROCEDURES  Procedure(s) performed: none  Critical Care performed: none  ____________________________________________   INITIAL IMPRESSION / ASSESSMENT AND PLAN / ED COURSE  Pertinent labs & imaging results that were available during my  care of the patient were reviewed by me and considered in my medical decision making (see chart for details).  Patient overall well-appearing. Complains of cough, mild shortness of breath and chest tightness most consistent with bronchospasm. Chest x-ray shows 6 normally or nodular opacity, radiologist is requesting CT scan which was unremarkable. We will treat the patient with steroids and neb and reevaluated   ----------------------------------------- 3:41 PM on 03/15/2015 -----------------------------------------  Patient reports feeling significant better, chest tightness has resolved. CT scan unremarkable. Vitals are all normal. We will treat her with steroids and albuterol inhaler with strict instructions to return if any worsening symptoms ____________________________________________   FINAL CLINICAL IMPRESSION(S) / ED DIAGNOSES  Final diagnoses:  Bronchospasm, acute  Upper respiratory infection     Jene Every, MD 03/15/15 1556

## 2015-03-15 NOTE — ED Notes (Addendum)
Pt [facility] with chest pain, SOB, and strong productive cough x 2 weeks. Pt given 325 mg aspirin en route via EMS. On arrival pt AAOx3, pain 10/10 central chest, pt states "the aspirin has helped, but there is still a lot of pressure there" Vitals wnl at this time, no acute distress noted.

## 2015-03-15 NOTE — Discharge Instructions (Signed)
Bronchospasm, Adult  A bronchospasm is a spasm or tightening of the airways going into the lungs. During a bronchospasm breathing becomes more difficult because the airways get smaller. When this happens there can be coughing, a whistling sound when breathing (wheezing), and difficulty breathing. Bronchospasm is often associated with asthma, but not all patients who experience a bronchospasm have asthma.  CAUSES   A bronchospasm is caused by inflammation or irritation of the airways. The inflammation or irritation may be triggered by:   · Allergies (such as to animals, pollen, food, or mold). Allergens that cause bronchospasm may cause wheezing immediately after exposure or many hours later.    · Infection. Viral infections are believed to be the most common cause of bronchospasm.    · Exercise.    · Irritants (such as pollution, cigarette smoke, strong odors, aerosol sprays, and paint fumes).    · Weather changes. Winds increase molds and pollens in the air. Rain refreshes the air by washing irritants out. Cold air may cause inflammation.    · Stress and emotional upset.    SIGNS AND SYMPTOMS   · Wheezing.    · Excessive nighttime coughing.    · Frequent or severe coughing with a simple cold.    · Chest tightness.    · Shortness of breath.    DIAGNOSIS   Bronchospasm is usually diagnosed through a history and physical exam. Tests, such as chest X-rays, are sometimes done to look for other conditions.  TREATMENT   · Inhaled medicines can be given to open up your airways and help you breathe. The medicines can be given using either an inhaler or a nebulizer machine.  · Corticosteroid medicines may be given for severe bronchospasm, usually when it is associated with asthma.  HOME CARE INSTRUCTIONS   · Always have a plan prepared for seeking medical care. Know when to call your health care provider and local emergency services (911 in the [place] Know where you can access local emergency care.  · Only take medicines as  directed by your health care provider.  · If you were prescribed an inhaler or nebulizer machine, ask your health care provider to explain how to use it correctly. Always use a spacer with your inhaler if you were given one.  · It is necessary to remain calm during an attack. Try to relax and breathe more slowly.   · Control your home environment in the following ways:      Change your heating and air conditioning filter at least once a month.      Limit your use of fireplaces and wood stoves.    Do not smoke and do not allow smoking in your home.      Avoid exposure to perfumes and fragrances.      Get rid of pests (such as roaches and mice) and their droppings.      Throw away plants if you see mold on them.      Keep your house clean and dust free.      Replace carpet with wood, tile, or vinyl flooring. Carpet can trap dander and dust.      Use allergy-proof pillows, mattress covers, and box spring covers.      Wash bed sheets and blankets every week in hot water and dry them in a dryer.      Use blankets that are made of polyester or cotton.      Wash hands frequently.  SEEK MEDICAL CARE IF:   · You have muscle aches.    · You have chest pain.    · The sputum changes from clear or   white to yellow, green, gray, or bloody.    · The sputum you cough up gets thicker.    · There are problems that may be related to the medicine you are given, such as a rash, itching, swelling, or trouble breathing.    SEEK IMMEDIATE MEDICAL CARE IF:   · You have worsening wheezing and coughing even after taking your prescribed medicines.    · You have increased difficulty breathing.    · You develop severe chest pain.  MAKE SURE YOU:   · Understand these instructions.  · Will watch your condition.  · Will get help right away if you are not doing well or get worse.     This information is not intended to replace advice given to you by your health care provider. Make sure you discuss any questions you have with your health care  provider.     Document Released: 04/12/2003 Document Revised: 04/30/2014 Document Reviewed: 09/29/2012  Elsevier Interactive Patient Education ©2016 [employer]

## 2015-03-15 NOTE — ED Notes (Signed)
Pt ambulatory to bathroom at this time with no concerns. Pt tolerated well, no acute distress noted.

## 2015-03-15 NOTE — ED Notes (Signed)
MD Kinner at bedside  

## 2015-03-22 ENCOUNTER — Telehealth: Payer: Self-pay | Admitting: Family Medicine

## 2015-03-22 MED ORDER — FLUTICASONE PROPIONATE 50 MCG/ACT NA SUSP: 2.0000 | Freq: Every day | NASAL | Status: AC

## 2015-03-22 MED ORDER — ATENOLOL-CHLORTHALIDONE 50-25 MG PO TABS: 0.5000 | ORAL_TABLET | Freq: Every day | ORAL | Status: DC

## 2015-03-22 NOTE — Telephone Encounter (Signed)
Pt needs a refill on atenonlol sent to [facility].  She also asked if there was anything she could take along with the zyrtec for her allergies.  Her call back number is [phone]

## 2015-03-22 NOTE — Telephone Encounter (Signed)
Patient aware.

## 2015-03-22 NOTE — Telephone Encounter (Signed)
Refill sent to her pharmacy.  I also sent her a refill on her fluticasone (flonase).  This will be helpful in controlling her allergies. Thanks! AK

## 2015-04-21 ENCOUNTER — Telehealth: Payer: Self-pay | Admitting: Family Medicine

## 2015-04-21 MED ORDER — ATENOLOL 50 MG PO TABS: 50.0000 mg | ORAL_TABLET | Freq: Every day | ORAL | Status: DC

## 2015-04-21 NOTE — Telephone Encounter (Signed)
Pt did pick up the atenolol with fluid pill but decided that's why she has been dizzy and nauseous.  She would like to go back to plain atenolol next time and if [facility] wants her on a fluid pill, she would like to try something different.  Her call back number is [phone]

## 2015-04-21 NOTE — Telephone Encounter (Signed)
I have sent atenolol without the fluid pill to her pharmacy. The tenoretic [facility]. I am not sure what happened, but she can restart her atenolol. She can pick it up anytime.

## 2015-04-22 NOTE — Telephone Encounter (Signed)
Left patient vmail Atenolol 50 mg has been sent to her pharmacy.

## 2015-05-03 ENCOUNTER — Emergency Department
Admission: EM | Admit: 2015-05-03 | Discharge: 2015-05-03 | Disposition: A | Discharge status: Home / Self Care | Payer: Self-pay | Attending: Emergency Medicine | Admitting: Emergency Medicine

## 2015-05-03 ENCOUNTER — Emergency Department: Payer: Self-pay

## 2015-05-03 ENCOUNTER — Encounter: Payer: Self-pay | Admitting: *Deleted

## 2015-05-03 DIAGNOSIS — S199XXA Unspecified injury of neck, initial encounter: Secondary | ICD-10-CM | POA: Insufficient documentation

## 2015-05-03 DIAGNOSIS — Y9389 Activity, other specified: Secondary | ICD-10-CM | POA: Insufficient documentation

## 2015-05-03 DIAGNOSIS — Z791 Long term (current) use of non-steroidal anti-inflammatories (NSAID): Secondary | ICD-10-CM | POA: Insufficient documentation

## 2015-05-03 DIAGNOSIS — W108XXA Fall (on) (from) other stairs and steps, initial encounter: Secondary | ICD-10-CM | POA: Insufficient documentation

## 2015-05-03 DIAGNOSIS — Z79899 Other long term (current) drug therapy: Secondary | ICD-10-CM | POA: Insufficient documentation

## 2015-05-03 DIAGNOSIS — Z7951 Long term (current) use of inhaled steroids: Secondary | ICD-10-CM | POA: Insufficient documentation

## 2015-05-03 DIAGNOSIS — S3992XA Unspecified injury of lower back, initial encounter: Secondary | ICD-10-CM | POA: Insufficient documentation

## 2015-05-03 DIAGNOSIS — S5012XA Contusion of left forearm, initial encounter: Secondary | ICD-10-CM | POA: Insufficient documentation

## 2015-05-03 DIAGNOSIS — Y92009 Unspecified place in unspecified non-institutional (private) residence as the place of occurrence of the external cause: Secondary | ICD-10-CM | POA: Insufficient documentation

## 2015-05-03 DIAGNOSIS — F17210 Nicotine dependence, cigarettes, uncomplicated: Secondary | ICD-10-CM | POA: Insufficient documentation

## 2015-05-03 DIAGNOSIS — Z7952 Long term (current) use of systemic steroids: Secondary | ICD-10-CM | POA: Insufficient documentation

## 2015-05-03 DIAGNOSIS — S40022A Contusion of left upper arm, initial encounter: Secondary | ICD-10-CM

## 2015-05-03 DIAGNOSIS — Y998 Other external cause status: Secondary | ICD-10-CM | POA: Insufficient documentation

## 2015-05-03 MED ORDER — OXYCODONE-ACETAMINOPHEN 5-325 MG PO TABS
2.0000 | ORAL_TABLET | Freq: Once | ORAL | Status: AC
Start: 2015-05-03 — End: 2015-05-03
  Administered 2015-05-03: 2 via ORAL
  Filled 2015-05-03: qty 2

## 2015-05-03 MED ORDER — IBUPROFEN 800 MG PO TABS: 800.0000 mg | ORAL_TABLET | Freq: Three times a day (TID) | ORAL | Status: AC | PRN

## 2015-05-03 MED ORDER — KETOROLAC TROMETHAMINE 60 MG/2ML IM SOLN
60.0000 mg | Freq: Once | INTRAMUSCULAR | Status: AC
  Administered 2015-05-03: 60 mg via INTRAMUSCULAR
  Filled 2015-05-03: qty 2

## 2015-05-03 MED ORDER — OXYCODONE-ACETAMINOPHEN 5-325 MG PO TABS: 1.0000 | ORAL_TABLET | ORAL | Status: DC | PRN

## 2015-05-03 NOTE — ED Notes (Signed)
Pt has left forearm pain.  Pt fell down 5 steps inside her home today.  No swelling noted.  Denies other injury

## 2015-05-03 NOTE — ED Provider Notes (Signed)
[facility] Emergency Department Provider Note  ____________________________________________  Time seen: Approximately 9:14 PM  I have reviewed the triage vital signs and the nursing notes.   HISTORY  Chief Complaint Arm Injury    HPI Michelle Boyle is a 60 y.o. female who presents for evaluation of left forearm pain. Patient states that she fell down 5 steps inside her home today. No swelling noted. In addition complains of lower neck pain. Previous history of surgery noted.   Past Medical History  Diagnosis Date  . Back pain   . Palpitations   . Allergy   . Arthritis   . Carpal tunnel syndrome   . Bunion     Patient Active Problem List   Diagnosis Date Noted  . H/O arthrodesis 11/30/2014  . S/P lumbar spine operation 11/30/2014  . Hyperlipidemia 10/13/2014  . Chronic neck and back pain 10/11/2014  . Carpal tunnel syndrome, bilateral 10/11/2014  . Depression 10/11/2014  . Allergic rhinitis 12/15/2012  . Generalized OA 05/30/2012  . Awareness of heartbeats 05/30/2012  . Back ache 07/31/2010  . Cervical osteoarthritis 07/31/2010  . Calculus of kidney 06/29/2010  . 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 Compulsive tobacco user syndrome 06/29/2010  . Diverticulitis of colon 08/25/2003    Past Surgical History  Procedure Laterality Date  . Back surgery    . Abdominal hysterectomy    . Cholecystectomy    . Back surgery  2005    2 back 1 fusion  . Neck surgery  2005    neck fusion  . Tubal ligation  1980  . Gum surgery  1990    Current Outpatient Rx  Name  Route  Sig  Dispense  Refill  . albuterol (VENTOLIN HFA) 108 (90 BASE) MCG/ACT inhaler   Inhalation   Inhale into the lungs.         Marland Kitchen. atenolol (TENORMIN) 50 MG tablet   Oral   Take 1 tablet (50 mg total) by mouth daily.   90 tablet   3   . atorvastatin (LIPITOR) 40 MG tablet   Oral   Take 1 tablet (40 mg total) by mouth daily.   30 tablet   2   . cetirizine (ZYRTEC) 10 MG  tablet   Oral   Take 1 tablet (10 mg total) by mouth daily.   30 tablet   2   . etodolac (LODINE) 200 MG capsule   Oral   Take 2 capsules (400 mg total) by mouth 3 (three) times daily.   84 capsule   3   . fluticasone (FLONASE) 50 MCG/ACT nasal spray   Each Nare   Place 2 sprays into both nostrils daily.   16 g   11   . ibuprofen (ADVIL,MOTRIN) 800 MG tablet   Oral   Take 1 tablet (800 mg total) by mouth every 8 (eight) hours as needed.   30 tablet   0   . Multiple Vitamin (MULTI-VITAMINS) TABS   Oral   Take by mouth.         . oxyCODONE-acetaminophen (ROXICET) 5-325 MG tablet   Oral   Take 1-2 tablets by mouth every 4 (four) hours as needed for severe pain.   10 tablet   0   . predniSONE (DELTASONE) 50 MG tablet   Oral   Take 1 tablet (50 mg total) by mouth daily with breakfast.   5 tablet   0   . promethazine (PHENERGAN) 25 MG tablet   Oral  Take 1 tablet (25 mg total) by mouth every 6 (six) hours as needed for nausea or vomiting.   30 tablet   0   . sertraline (ZOLOFT) 100 MG tablet   Oral   Take 100 mg by mouth daily.           Allergies Codeine; Levaquin; Sulfa antibiotics; Gabapentin; Levofloxacin; and Tramadol  Family History  Problem Relation Age of Onset  . Heart disease Father   . Heart disease Sister   . Cancer Sister   . Cancer Brother     pencreatic and bladder  . Cancer Maternal Grandmother   . Cancer Paternal Grandmother     Social History Social History  Substance Use Topics  . Smoking status: Current Every Day Smoker -- 0.50 packs/day    Types: Cigarettes  . Smokeless tobacco: Never Used  . Alcohol Use: No    Review of Systems Constitutional: No fever/chills Eyes: No visual changes. ENT: No sore throat. Cardiovascular: Denies chest pain. Respiratory: Denies shortness of breath. Gastrointestinal: No abdominal pain.  No nausea, no vomiting.  No diarrhea.  No constipation. Genitourinary: Negative for  dysuria. Musculoskeletal: Positive for left forearm pain. Positive for low back pain. Skin: Negative for rash. Neurological: Negative for headaches, focal weakness or numbness.  10-point ROS otherwise negative.  ____________________________________________   PHYSICAL EXAM:  VITAL SIGNS: ED Triage Vitals  Enc Vitals Group     BP 05/03/15 2003 118/84 mmHg     Pulse Rate 05/03/15 2003 73     Resp 05/03/15 2003 18     Temp 05/03/15 2003 97.9 F (36.6 C)     Temp Source 05/03/15 2003 Oral     SpO2 05/03/15 2003 99 %     Weight 05/03/15 2003 164 lb (74.39 kg)     Height 05/03/15 2003 5\' 4"  (1.626 m)     Head Cir --      Peak Flow --      Pain Score 05/03/15 2004 9     Pain Loc --      Pain Edu? --      Excl. in GC? --     Constitutional: Alert and oriented. Well appearing and in no acute distress. Cardiovascular: Normal rate, regular rhythm. Grossly normal heart sounds.  Good peripheral circulation. Respiratory: Normal respiratory effort.  No retractions. Lungs CTAB. Musculoskeletal: Right formal full range of motion and distally neurovascularly intact. Point tenderness noted to the lumbar spine with postoperative scarring noted. No ecchymosis in either place. Neurologic:  Normal speech and language. No gross focal neurologic deficits are appreciated. No gait instability. Skin:  Skin is warm, dry and intact. No rash noted. Psychiatric: Mood and affect are normal. Speech and behavior are normal.  ____________________________________________   LABS (all labs ordered are listed, but only abnormal results are displayed)  Labs Reviewed - No data to display   RADIOLOGY  Lumbar spine stable with no acute osseous findings. Right forearm negative for acute fracture. ____________________________________________   PROCEDURES  Procedure(s) performed: None  Critical Care performed: No  ____________________________________________   INITIAL IMPRESSION / ASSESSMENT AND PLAN  / ED COURSE  Pertinent labs & imaging results that were available during my care of the patient were reviewed by me and considered in my medical decision making (see chart for details).  Status post fall with right arm contusion tailbone contusion. Rx given for Motrin 800 mg 3 times a day, Percocet 5/325. Patient to follow up PCP or return to the ER with  any worsening symptomology. Patient voices no other emergency medical complaints at this time. ____________________________________________   FINAL CLINICAL IMPRESSION(S) / ED DIAGNOSES  Final diagnoses:  Fall (on) (from) other stairs and steps, initial encounter  Arm contusion, left, initial encounter      Evangeline Dakin, PA-C 05/03/15 2125  Rockne Menghini, MD 05/04/15 1610

## 2015-05-03 NOTE — Discharge Instructions (Signed)
Contusion °A contusion is a deep bruise. Contusions are the result of a blunt injury to tissues and muscle fibers under the skin. The injury causes bleeding under the skin. The skin overlying the contusion may turn blue, purple, or yellow. Minor injuries will give you a painless contusion, but more severe contusions may stay painful and swollen for a few weeks.  °CAUSES  °This condition is usually caused by a blow, trauma, or direct force to an area of the body. °SYMPTOMS  °Symptoms of this condition include: °· Swelling of the injured area. °· Pain and tenderness in the injured area. °· Discoloration. The area may have redness and then turn blue, purple, or yellow. °DIAGNOSIS  °This condition is diagnosed based on a physical exam and medical history. An X-ray, CT scan, or MRI may be needed to determine if there are any associated injuries, such as broken bones (fractures). °TREATMENT  °Specific treatment for this condition depends on what area of the body was injured. In general, the best treatment for a contusion is resting, icing, applying pressure to (compression), and elevating the injured area. This is often called the RICE strategy. Over-the-counter anti-inflammatory medicines may also be recommended for pain control.  °HOME CARE INSTRUCTIONS  °· Rest the injured area. °· If directed, apply ice to the injured area: °· Put ice in a plastic bag. °· Place a towel between your skin and the bag. °· Leave the ice on for 20 minutes, 2-3 times per day. °· If directed, apply light compression to the injured area using an elastic bandage. Make sure the bandage is not wrapped too tightly. Remove and reapply the bandage as directed by your health care provider. °· If possible, raise (elevate) the injured area above the level of your heart while you are sitting or lying down. °· Take over-the-counter and prescription medicines only as told by your health care provider. °SEEK MEDICAL CARE IF: °· Your symptoms do not  improve after several days of treatment. °· Your symptoms get worse. °· You have difficulty moving the injured area. °SEEK IMMEDIATE MEDICAL CARE IF:  °· You have severe pain. °· You have numbness in a hand or foot. °· Your hand or foot turns pale or cold. °  °This information is not intended to replace advice given to you by your health care provider. Make sure you discuss any questions you have with your health care provider. °  °Document Released: 01/17/2005 Document Revised: 12/29/2014 Document Reviewed: 08/25/2014 °Elsevier Interactive Patient Education ©2016 [employer] ° °Cryotherapy °Cryotherapy means treatment with cold. Ice or gel packs can be used to reduce both pain and swelling. Ice is the most helpful within the first 24 to 48 hours after an injury or flare-up from overusing a muscle or joint. Sprains, strains, spasms, burning pain, shooting pain, and aches can all be eased with ice. Ice can also be used when recovering from surgery. Ice is effective, has very few side effects, and is safe for most people to use. °PRECAUTIONS  °Ice is not a safe treatment option for people with: °· Raynaud phenomenon. This is a condition affecting small blood vessels in the extremities. Exposure to cold may cause your problems to return. °· Cold hypersensitivity. There are many forms of cold hypersensitivity, including: °¨ Cold urticaria. Red, itchy hives appear on the skin when the tissues begin to warm after being iced. °¨ Cold erythema. This is a red, itchy rash caused by exposure to cold. °¨ Cold hemoglobinuria. Red blood cells   break down when the tissues begin to warm after being iced. The hemoglobin that carry oxygen are passed into the urine because they cannot combine with blood proteins fast enough. °· Numbness or altered sensitivity in the area being iced. °If you have any of the following conditions, do not use ice until you have discussed cryotherapy with your caregiver: °· Heart conditions, such as  arrhythmia, angina, or chronic heart disease. °· High blood pressure. °· Healing wounds or open skin in the area being iced. °· Current infections. °· Rheumatoid arthritis. °· Poor circulation. °· Diabetes. °Ice slows the blood flow in the region it is applied. This is beneficial when trying to stop inflamed tissues from spreading irritating chemicals to surrounding tissues. However, if you expose your skin to cold temperatures for too long or without the proper protection, you can damage your skin or nerves. Watch for signs of skin damage due to cold. °HOME CARE INSTRUCTIONS °Follow these tips to use ice and cold packs safely. °· Place a dry or damp towel between the ice and skin. A damp towel will cool the skin more quickly, so you may need to shorten the time that the ice is used. °· For a more rapid response, add gentle compression to the ice. °· Ice for no more than 10 to 20 minutes at a time. The bonier the area you are icing, the less time it will take to get the benefits of ice. °· Check your skin after 5 minutes to make sure there are no signs of a poor response to cold or skin damage. °· Rest 20 minutes or more between uses. °· Once your skin is numb, you can end your treatment. You can test numbness by very lightly touching your skin. The touch should be so light that you do not see the skin dimple from the pressure of your fingertip. When using ice, most people will feel these normal sensations in this order: cold, burning, aching, and numbness. °· Do not use ice on someone who cannot communicate their responses to pain, such as small children or people with dementia. °HOW TO MAKE AN ICE PACK °Ice packs are the most common way to use ice therapy. Other methods include ice massage, ice baths, and cryosprays. Muscle creams that cause a cold, tingly feeling do not offer the same benefits that ice offers and should not be used as a substitute unless recommended by your caregiver. °To make an ice pack, do one  of the following: °· Place crushed ice or a bag of frozen vegetables in a sealable plastic bag. Squeeze out the excess air. Place this bag inside another plastic bag. Slide the bag into a pillowcase or place a damp towel between your skin and the bag. °· Mix 3 parts water with 1 part rubbing alcohol. Freeze the mixture in a sealable plastic bag. When you remove the mixture from the freezer, it will be slushy. Squeeze out the excess air. Place this bag inside another plastic bag. Slide the bag into a pillowcase or place a damp towel between your skin and the bag. °SEEK MEDICAL CARE IF: °· You develop white spots on your skin. This may give the skin a blotchy (mottled) appearance. °· Your skin turns blue or pale. °· Your skin becomes waxy or hard. °· Your swelling gets worse. °MAKE SURE YOU:  °· Understand these instructions. °· Will watch your condition. °· Will get help right away if you are not doing well or get worse. °  °  This information is not intended to replace advice given to you by your health care provider. Make sure you discuss any questions you have with your health care provider. °  °Document Released: 12/04/2010 Document Revised: 04/30/2014 Document Reviewed: 12/04/2010 °Elsevier Interactive Patient Education ©2016 [employer] Inc[employer] ° °

## 2015-05-16 ENCOUNTER — Telehealth: Payer: Self-pay | Admitting: Family Medicine

## 2015-05-16 NOTE — Telephone Encounter (Signed)
Error

## 2015-05-20 ENCOUNTER — Encounter: Payer: Self-pay | Admitting: Family Medicine

## 2015-05-20 ENCOUNTER — Observation Stay
Admission: EM | Admit: 2015-05-20 | Discharge: 2015-05-23 | DRG: 392 | Disposition: A | Discharge status: Home / Self Care | Payer: BLUE CROSS/BLUE SHIELD | Attending: Surgery | Admitting: Surgery

## 2015-05-20 ENCOUNTER — Ambulatory Visit
Admission: RE | Admit: 2015-05-20 | Discharge: 2015-05-20 | Disposition: A | Discharge status: Home / Self Care | Payer: BLUE CROSS/BLUE SHIELD | Source: Ambulatory Visit | Attending: Family Medicine | Admitting: Family Medicine

## 2015-05-20 ENCOUNTER — Encounter: Payer: Self-pay | Admitting: Emergency Medicine

## 2015-05-20 ENCOUNTER — Ambulatory Visit (INDEPENDENT_AMBULATORY_CARE_PROVIDER_SITE_OTHER): Payer: BLUE CROSS/BLUE SHIELD | Admitting: Family Medicine

## 2015-05-20 ENCOUNTER — Emergency Department: Payer: BLUE CROSS/BLUE SHIELD

## 2015-05-20 VITALS — BP 113/72 | HR 79 | Temp 98.0°F | Resp 16 | Ht 64.0 in | Wt 171.0 lb

## 2015-05-20 DIAGNOSIS — Z981 Arthrodesis status: Secondary | ICD-10-CM | POA: Diagnosis not present

## 2015-05-20 DIAGNOSIS — Z8249 Family history of ischemic heart disease and other diseases of the circulatory system: Secondary | ICD-10-CM

## 2015-05-20 DIAGNOSIS — Z8052 Family history of malignant neoplasm of bladder: Secondary | ICD-10-CM

## 2015-05-20 DIAGNOSIS — Z808 Family history of malignant neoplasm of other organs or systems: Secondary | ICD-10-CM | POA: Diagnosis not present

## 2015-05-20 DIAGNOSIS — R1110 Vomiting, unspecified: Secondary | ICD-10-CM

## 2015-05-20 DIAGNOSIS — Z9889 Other specified postprocedural states: Secondary | ICD-10-CM | POA: Diagnosis not present

## 2015-05-20 DIAGNOSIS — G8929 Other chronic pain: Secondary | ICD-10-CM | POA: Diagnosis not present

## 2015-05-20 DIAGNOSIS — Z7951 Long term (current) use of inhaled steroids: Secondary | ICD-10-CM | POA: Diagnosis not present

## 2015-05-20 DIAGNOSIS — Z809 Family history of malignant neoplasm, unspecified: Secondary | ICD-10-CM

## 2015-05-20 DIAGNOSIS — K5732 Diverticulitis of large intestine without perforation or abscess without bleeding: Secondary | ICD-10-CM

## 2015-05-20 DIAGNOSIS — F17210 Nicotine dependence, cigarettes, uncomplicated: Secondary | ICD-10-CM | POA: Diagnosis not present

## 2015-05-20 DIAGNOSIS — Z888 Allergy status to other drugs, medicaments and biological substances status: Secondary | ICD-10-CM

## 2015-05-20 DIAGNOSIS — Z79899 Other long term (current) drug therapy: Secondary | ICD-10-CM

## 2015-05-20 DIAGNOSIS — K5900 Constipation, unspecified: Secondary | ICD-10-CM | POA: Diagnosis present

## 2015-05-20 DIAGNOSIS — Z886 Allergy status to analgesic agent status: Secondary | ICD-10-CM | POA: Diagnosis not present

## 2015-05-20 DIAGNOSIS — Z882 Allergy status to sulfonamides status: Secondary | ICD-10-CM

## 2015-05-20 DIAGNOSIS — Z90710 Acquired absence of both cervix and uterus: Secondary | ICD-10-CM | POA: Diagnosis not present

## 2015-05-20 DIAGNOSIS — Z9049 Acquired absence of other specified parts of digestive tract: Secondary | ICD-10-CM

## 2015-05-20 DIAGNOSIS — E860 Dehydration: Secondary | ICD-10-CM

## 2015-05-20 DIAGNOSIS — Z9851 Tubal ligation status: Secondary | ICD-10-CM | POA: Diagnosis not present

## 2015-05-20 DIAGNOSIS — M549 Dorsalgia, unspecified: Secondary | ICD-10-CM | POA: Diagnosis not present

## 2015-05-20 DIAGNOSIS — K921 Melena: Secondary | ICD-10-CM | POA: Diagnosis present

## 2015-05-20 LAB — URINALYSIS COMPLETE WITH MICROSCOPIC (ARMC ONLY)
BACTERIA UA: NONE SEEN
BILIRUBIN URINE: NEGATIVE
GLUCOSE, UA: NEGATIVE mg/dL
HGB URINE DIPSTICK: NEGATIVE
KETONES UR: NEGATIVE mg/dL
NITRITE: NEGATIVE
Protein, ur: NEGATIVE mg/dL
SPECIFIC GRAVITY, URINE: 1.018 (ref 1.005–1.030)
pH: 6 (ref 5.0–8.0)

## 2015-05-20 LAB — CBC
HCT: 38.2 % (ref 35.0–47.0)
Hemoglobin: 13.3 g/dL (ref 12.0–16.0)
MCH: 31.8 pg (ref 26.0–34.0)
MCHC: 34.9 g/dL (ref 32.0–36.0)
MCV: 91.1 fL (ref 80.0–100.0)
PLATELETS: 301 10*3/uL (ref 150–440)
RBC: 4.19 MIL/uL (ref 3.80–5.20)
RDW: 12.1 % (ref 11.5–14.5)
WBC: 8.2 10*3/uL (ref 3.6–11.0)

## 2015-05-20 LAB — COMPREHENSIVE METABOLIC PANEL
ALBUMIN: 4.2 g/dL (ref 3.5–5.0)
ALK PHOS: 56 U/L (ref 38–126)
ALT: 29 U/L (ref 14–54)
AST: 19 U/L (ref 15–41)
Anion gap: 9 (ref 5–15)
BILIRUBIN TOTAL: 0.8 mg/dL (ref 0.3–1.2)
BUN: 14 mg/dL (ref 6–20)
CO2: 30 mmol/L (ref 22–32)
CREATININE: 0.83 mg/dL (ref 0.44–1.00)
Calcium: 9.3 mg/dL (ref 8.9–10.3)
Chloride: 103 mmol/L (ref 101–111)
GFR calc Af Amer: >60 mL/min (ref >60)
GLUCOSE: 100 mg/dL — AB (ref 65–99)
Potassium: 3.3 mmol/L — ABNORMAL LOW (ref 3.5–5.1)
Sodium: 142 mmol/L (ref 135–145)
TOTAL PROTEIN: 7.3 g/dL (ref 6.5–8.1)

## 2015-05-20 LAB — LIPASE, BLOOD: Lipase: 17 U/L (ref 11–51)

## 2015-05-20 MED ORDER — PIPERACILLIN-TAZOBACTAM 3.375 G IVPB
3.3750 g | Freq: Three times a day (TID) | INTRAVENOUS | Status: DC
  Administered 2015-05-20 – 2015-05-23 (×8): 3.375 g via INTRAVENOUS
  Filled 2015-05-20 (×10): qty 50

## 2015-05-20 MED ORDER — ONDANSETRON HCL 4 MG/2ML IJ SOLN
4.0000 mg | Freq: Once | INTRAMUSCULAR | Status: AC
  Administered 2015-05-20: 4 mg via INTRAVENOUS
  Filled 2015-05-20: qty 2

## 2015-05-20 MED ORDER — SERTRALINE HCL 50 MG PO TABS
100.0000 mg | ORAL_TABLET | Freq: Every day | ORAL | Status: DC
  Administered 2015-05-21 – 2015-05-22 (×3): 100 mg via ORAL
  Filled 2015-05-20 (×3): qty 2

## 2015-05-20 MED ORDER — PANTOPRAZOLE SODIUM 40 MG IV SOLR
40.0000 mg | Freq: Every day | INTRAVENOUS | Status: DC
  Administered 2015-05-20 – 2015-05-22 (×3): 40 mg via INTRAVENOUS
  Filled 2015-05-20 (×3): qty 40

## 2015-05-20 MED ORDER — ENOXAPARIN SODIUM 40 MG/0.4ML ~~LOC~~ SOLN
40.0000 mg | SUBCUTANEOUS | Status: DC
  Administered 2015-05-20 – 2015-05-22 (×3): 40 mg via SUBCUTANEOUS
  Filled 2015-05-20 (×3): qty 0.4

## 2015-05-20 MED ORDER — MORPHINE SULFATE (PF) 4 MG/ML IV SOLN
4.0000 mg | INTRAVENOUS | Status: DC | PRN
  Administered 2015-05-21 – 2015-05-22 (×2): 4 mg via INTRAVENOUS
  Filled 2015-05-20 (×2): qty 1

## 2015-05-20 MED ORDER — HYDROCODONE-ACETAMINOPHEN 5-325 MG PO TABS
1.0000 | ORAL_TABLET | ORAL | Status: DC | PRN
  Administered 2015-05-20: 2 via ORAL
  Filled 2015-05-20: qty 2

## 2015-05-20 MED ORDER — ATENOLOL 25 MG PO TABS
50.0000 mg | ORAL_TABLET | Freq: Every day | ORAL | Status: DC
  Administered 2015-05-21 – 2015-05-22 (×3): 50 mg via ORAL
  Filled 2015-05-20 (×3): qty 2

## 2015-05-20 MED ORDER — ONDANSETRON HCL 4 MG/2ML IJ SOLN
4.0000 mg | Freq: Four times a day (QID) | INTRAMUSCULAR | Status: DC | PRN
  Administered 2015-05-21 – 2015-05-22 (×3): 4 mg via INTRAVENOUS
  Filled 2015-05-20 (×3): qty 2

## 2015-05-20 MED ORDER — ONDANSETRON 8 MG PO TBDP
4.0000 mg | ORAL_TABLET | Freq: Four times a day (QID) | ORAL | Status: DC | PRN
  Administered 2015-05-21: 4 mg via ORAL
  Filled 2015-05-20: qty 1

## 2015-05-20 MED ORDER — INFLUENZA VAC SPLIT QUAD 0.5 ML IM SUSY
0.5000 mL | PREFILLED_SYRINGE | INTRAMUSCULAR | Status: AC
  Administered 2015-05-21: 0.5 mL via INTRAMUSCULAR
  Filled 2015-05-20 (×2): qty 0.5

## 2015-05-20 MED ORDER — IOHEXOL 300 MG/ML  SOLN
100.0000 mL | Freq: Once | INTRAMUSCULAR | Status: AC | PRN
  Administered 2015-05-20: 100 mL via INTRAVENOUS

## 2015-05-20 MED ORDER — KETOROLAC TROMETHAMINE 30 MG/ML IJ SOLN
30.0000 mg | Freq: Four times a day (QID) | INTRAMUSCULAR | Status: DC
  Administered 2015-05-20 – 2015-05-23 (×11): 30 mg via INTRAVENOUS
  Filled 2015-05-20 (×11): qty 1

## 2015-05-20 MED ORDER — MORPHINE SULFATE (PF) 4 MG/ML IV SOLN
4.0000 mg | Freq: Once | INTRAVENOUS | Status: AC
  Administered 2015-05-20: 4 mg via INTRAVENOUS
  Filled 2015-05-20: qty 1

## 2015-05-20 MED ORDER — IOHEXOL 240 MG/ML SOLN
25.0000 mL | Freq: Once | INTRAMUSCULAR | Status: AC | PRN
  Administered 2015-05-20: 25 mL via INTRAVENOUS

## 2015-05-20 MED ORDER — KCL IN DEXTROSE-NACL 20-5-0.9 MEQ/L-%-% IV SOLN
INTRAVENOUS | Status: DC
  Administered 2015-05-20 – 2015-05-23 (×9): via INTRAVENOUS
  Filled 2015-05-20 (×14): qty 1000

## 2015-05-20 NOTE — ED Provider Notes (Signed)
[facility] Emergency Department Provider Note  ____________________________________________    I have reviewed the triage vital signs and the nursing notes.   HISTORY  Chief Complaint Nausea  abdominal pain   HPI Michelle Boyle is a 60 y.o. female who presents with complaints of abdominal pain and nausea. Patient was recently seen at [facility] where she had a CT scan which did show diverticulitis of the descending colon. She was sent home and she reports she is feeling worse even though she has been compliant with outpatient antibiotics. She denies fevers or chills. She complains of left lower quadrant abdominal pain that is moderate to severe.     Past Medical History  Diagnosis Date  . Back pain   . Palpitations   . Allergy   . Arthritis   . Carpal tunnel syndrome   . Bunion     Patient Active Problem List   Diagnosis Date Noted  . H/O arthrodesis 11/30/2014  . S/P lumbar spine operation 11/30/2014  . Hyperlipidemia 10/13/2014  . Chronic neck and back pain 10/11/2014  . Carpal tunnel syndrome, bilateral 10/11/2014  . Depression 10/11/2014  . Allergic rhinitis 12/15/2012  . Generalized OA 05/30/2012  . Awareness of heartbeats 05/30/2012  . Back ache 07/31/2010  . Cervical osteoarthritis 07/31/2010  . Calculus of kidney 06/29/2010  . 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 Compulsive tobacco user syndrome 06/29/2010  . Diverticulitis of colon 08/25/2003    Past Surgical History  Procedure Laterality Date  . Back surgery    . Abdominal hysterectomy    . Cholecystectomy    . Back surgery  2005    2 back 1 fusion  . Neck surgery  2005    neck fusion  . Tubal ligation  1980  . Gum surgery  1990    Current Outpatient Rx  Name  Route  Sig  Dispense  Refill  . albuterol (VENTOLIN HFA) 108 (90 BASE) MCG/ACT inhaler   Inhalation   Inhale into the lungs. Reported on 05/20/2015         . amoxicillin-clavulanate (AUGMENTIN) 875-125 MG tablet   Oral   Take by mouth.         Marland Kitchen atenolol (TENORMIN) 50 MG tablet   Oral   Take 1 tablet (50 mg total) by mouth daily.   90 tablet   3   . atorvastatin (LIPITOR) 40 MG tablet   Oral   Take 1 tablet (40 mg total) by mouth daily. Patient not taking: Reported on 05/20/2015   30 tablet   2   . cetirizine (ZYRTEC) 10 MG tablet   Oral   Take 1 tablet (10 mg total) by mouth daily. Patient not taking: Reported on 05/20/2015   30 tablet   2   . etodolac (LODINE) 200 MG capsule   Oral   Take 2 capsules (400 mg total) by mouth 3 (three) times daily. Patient not taking: Reported on 05/20/2015   84 capsule   3   . fluticasone (FLONASE) 50 MCG/ACT nasal spray   Each Nare   Place 2 sprays into both nostrils daily. Patient not taking: Reported on 05/20/2015   16 g   11   . ibuprofen (ADVIL,MOTRIN) 800 MG tablet   Oral   Take 1 tablet (800 mg total) by mouth every 8 (eight) hours as needed. Patient not taking: Reported on 05/20/2015   30 tablet   0   . Multiple Vitamin (MULTI-VITAMINS) TABS   Oral   Take by  mouth. Reported on 05/20/2015         . oxyCODONE-acetaminophen (ROXICET) 5-325 MG tablet   Oral   Take 1-2 tablets by mouth every 4 (four) hours as needed for severe pain.   10 tablet   0   . predniSONE (DELTASONE) 50 MG tablet   Oral   Take 1 tablet (50 mg total) by mouth daily with breakfast. Patient not taking: Reported on 05/20/2015   5 tablet   0   . promethazine (PHENERGAN) 25 MG tablet   Oral   Take 1 tablet (25 mg total) by mouth every 6 (six) hours as needed for nausea or vomiting. Patient not taking: Reported on 05/20/2015   30 tablet   0   . sertraline (ZOLOFT) 100 MG tablet   Oral   Take 100 mg by mouth daily. Reported on 05/20/2015           Allergies Codeine; Levaquin; Sulfa antibiotics; Gabapentin; Levofloxacin; and Tramadol  Family History  Problem Relation Age of Onset  . Heart disease Father   . Heart disease Sister   . Cancer Sister   .  Cancer Brother     pencreatic and bladder  . Cancer Maternal Grandmother   . Cancer Paternal Grandmother     Social History Social History  Substance Use Topics  . Smoking status: Current Every Day Smoker -- 0.50 packs/day    Types: Cigarettes  . Smokeless tobacco: Never Used  . Alcohol Use: No    Review of Systems  Constitutional: Negative for fever. Eyes: Negative for visual changes. ENT: Negative for sore throat Cardiovascular: Negative for chest pain. Respiratory: Negative for shortness of breath. Gastrointestinal: Abdominal pain as above Genitourinary: Negative for dysuria. Musculoskeletal: Negative for back pain. Skin: Negative for rash. Neurological: Negative for headaches Psychiatric anxiety    ____________________________________________   PHYSICAL EXAM:  VITAL SIGNS: ED Triage Vitals  Enc Vitals Group     BP 05/20/15 1603 136/84 mmHg     Pulse Rate 05/20/15 1603 65     Resp 05/20/15 1603 18     Temp 05/20/15 1603 97.8 F (36.6 C)     Temp Source 05/20/15 1603 Oral     SpO2 05/20/15 1603 98 %     Weight 05/20/15 1603 171 lb (77.565 kg)     Height 05/20/15 1603  (1.626 m)     Head Cir --      Peak Flow --      Pain Score 05/20/15 1605 6     Pain Loc --      Pain Edu? --      Excl. in GC? --      Constitutional: Alert and oriented. Well appearing and in no distress. Eyes: Conjunctivae are normal.  ENT   Head: Normocephalic and atraumatic.   Mouth/Throat: Mucous membranes are moist. Cardiovascular: Normal rate, regular rhythm. Normal and symmetric distal pulses are present in all extremities. No murmurs, rubs, or gallops. Respiratory: Normal respiratory effort without tachypnea nor retractions. Breath sounds are clear and equal bilaterally.  Gastrointestinal: Tenderness to palpation in the left lower quadrant, no rigidity or peritoneal signs. No distention. There is no CVA tenderness. Genitourinary: deferred Musculoskeletal: Nontender  with normal range of motion in all extremities. No lower extremity tenderness nor edema. Neurologic:  Normal speech and language. No gross focal neurologic deficits are appreciated. Skin:  Skin is warm, dry and intact. No rash noted. Psychiatric: Mood and affect are normal. Patient exhibits appropriate insight and judgment.  ____________________________________________    LABS (pertinent positives/negatives)  Labs Reviewed  COMPREHENSIVE METABOLIC PANEL - Abnormal; Notable for the following:    Potassium 3.3 (*)    Glucose, Bld 100 (*)    All other components within normal limits  LIPASE, BLOOD  CBC  URINALYSIS COMPLETEWITH MICROSCOPIC ([facility] ONLY)    ____________________________________________   EKG  None  ____________________________________________    RADIOLOGY I have personally reviewed any xrays that were ordered on this patient: CT abdomen and pelvis shows mild diverticulitis  ____________________________________________   PROCEDURES  Procedure(s) performed: none  Critical Care performed: none  ____________________________________________   INITIAL IMPRESSION / ASSESSMENT AND PLAN / ED COURSE  Pertinent labs & imaging results that were available during my care of the patient were reviewed by me and considered in my medical decision making (see chart for details).  Surgery consulted given CT scan from [facility] show diverticulitis via care everywhere. Patient has essentially failed outpatient treatment. General surgery requested repeat CT scan and will consult   Surgery to admit the patient ____________________________________________   FINAL CLINICAL IMPRESSION(S) / ED DIAGNOSES  Final diagnoses:  Diverticulitis of large intestine without perforation or abscess without bleeding     Jene Every, MD 05/20/15 1825

## 2015-05-20 NOTE — H&P (Signed)
Patient ID: Michelle Boyle, female   DOB: Feb 11, 1956, 60 y.o.   MRN: [id number]  History of Present Illness Michelle Boyle is a 60 y.o. female with left lower quadrant abdominal pain. Pain is intermittent moderate to severe and is been going on for the last month or so. He has exacerbated over the last 2 days and she has experienced significant nausea and vomiting. She has been to [facility] ER 2 days ago and was prescribed Augmentin which she was unable to take because of the nausea. Of note she has had multiple recurrent episodes of diverticulitis over the last 10 years and has had previous workup including a colonoscopy revealing diverticulosis. This is being done in an outside hospital. No previous colectomies or bowel operations. She does have significant chronic back pain and has had a history of spinal fusion in the past. No fevers or chills  Past Medical History Past Medical History  Diagnosis Date  . Back pain   . Palpitations   . Allergy   . Arthritis   . Carpal tunnel syndrome   . Bunion       Past Surgical History  Procedure Laterality Date  . Back surgery    . Abdominal hysterectomy    . Cholecystectomy    . Back surgery  2005    2 back 1 fusion  . Neck surgery  2005    neck fusion  . Tubal ligation  1980  . Gum surgery  1990    Allergies  Allergen Reactions  . Codeine     VOMITING  . Levaquin [Levofloxacin In D5w]   . Sulfa Antibiotics     "I can't remember how I am allergic"  . Gabapentin     Sedation  . Levofloxacin     Rash  . Tramadol     Sedation    Current Facility-Administered Medications  Medication Dose Route Frequency Provider Last Rate Last Dose  . dextrose 5 % and 0.9 % NaCl with KCl 20 mEq/L infusion   Intravenous Continuous Jinan Biggins F Angelia Hazell, MD      . enoxaparin (LOVENOX) injection 40 mg  40 mg Subcutaneous Q24H Xiomara Sevillano F Babak Lucus, MD      . HYDROcodone-acetaminophen (NORCO/VICODIN) 5-325 MG per tablet 1-2 tablet  1-2 tablet Oral Q4H PRN Allina Riches F  Leopold Smyers, MD      . ketorolac (TORADOL) 30 MG/ML injection 30 mg  30 mg Intravenous 4 times per day Leafy Ro, MD      . morphine 4 MG/ML injection 4 mg  4 mg Intravenous Q2H PRN Jordynn Marcella F Ansel Ferrall, MD      . ondansetron (ZOFRAN-ODT) disintegrating tablet 4 mg  4 mg Oral Q6H PRN Rahkim Rabalais F Alixandria Friedt, MD       Or  . ondansetron (ZOFRAN) injection 4 mg  4 mg Intravenous Q6H PRN Takao Lizer F Nikolette Reindl, MD      . pantoprazole (PROTONIX) injection 40 mg  40 mg Intravenous QHS Edan Juday F Lenn Volker, MD      . piperacillin-tazobactam (ZOSYN) IVPB 3.375 g  3.375 g Intravenous 3 times per day Leafy Ro, MD       Current Outpatient Prescriptions  Medication Sig Dispense Refill  . albuterol (VENTOLIN HFA) 108 (90 BASE) MCG/ACT inhaler Inhale into the lungs. Reported on 05/20/2015    . amoxicillin-clavulanate (AUGMENTIN) 875-125 MG tablet Take by mouth.    Marland Kitchen atenolol (TENORMIN) 50 MG tablet Take 1 tablet (50 mg total) by mouth daily. 90 tablet 3  .  atorvastatin (LIPITOR) 40 MG tablet Take 1 tablet (40 mg total) by mouth daily. (Patient not taking: Reported on 05/20/2015) 30 tablet 2  . cetirizine (ZYRTEC) 10 MG tablet Take 1 tablet (10 mg total) by mouth daily. (Patient not taking: Reported on 05/20/2015) 30 tablet 2  . etodolac (LODINE) 200 MG capsule Take 2 capsules (400 mg total) by mouth 3 (three) times daily. (Patient not taking: Reported on 05/20/2015) 84 capsule 3  . fluticasone (FLONASE) 50 MCG/ACT nasal spray Place 2 sprays into both nostrils daily. (Patient not taking: Reported on 05/20/2015) 16 g 11  . ibuprofen (ADVIL,MOTRIN) 800 MG tablet Take 1 tablet (800 mg total) by mouth every 8 (eight) hours as needed. (Patient not taking: Reported on 05/20/2015) 30 tablet 0  . Multiple Vitamin (MULTI-VITAMINS) TABS Take by mouth. Reported on 05/20/2015    . oxyCODONE-acetaminophen (ROXICET) 5-325 MG tablet Take 1-2 tablets by mouth every 4 (four) hours as needed for severe pain. 10 tablet 0  . predniSONE (DELTASONE) 50 MG tablet Take  1 tablet (50 mg total) by mouth daily with breakfast. (Patient not taking: Reported on 05/20/2015) 5 tablet 0  . promethazine (PHENERGAN) 25 MG tablet Take 1 tablet (25 mg total) by mouth every 6 (six) hours as needed for nausea or vomiting. (Patient not taking: Reported on 05/20/2015) 30 tablet 0  . sertraline (ZOLOFT) 100 MG tablet Take 100 mg by mouth daily. Reported on 05/20/2015      Family History Family History  Problem Relation Age of Onset  . Heart disease Father   . Heart disease Sister   . Cancer Sister   . Cancer Brother     pencreatic and bladder  . Cancer Maternal Grandmother   . Cancer Paternal Grandmother        Social History Social History  Substance Use Topics  . Smoking status: Current Every Day Smoker -- 0.50 packs/day    Types: Cigarettes  . Smokeless tobacco: Never Used  . Alcohol Use: No        ROS  Temporary review of system was performed and is otherwise negative other than stated in the history of present illness   Physical Exam Blood pressure 124/79, pulse 59, temperature 97.8 F (36.6 C), temperature source Oral, resp. rate 16, height  (1.626 m), weight 77.565 kg (171 lb), SpO2 98 %.  CONSTITUTIONAL: No acute distress complaining of abdominal pain EYES: Pupils equal, round, and reactive to light, Sclera non-icteric. EARS, NOSE, MOUTH AND THROAT: The oropharynx is clear. Oral mucosa is pink and moist. Hearing is intact to voice.  NECK: Trachea is midline, and there is no jugular venous distension. Thyroid is without palpable abnormalities. LYMPH NODES:  Lymph nodes in the neck are not enlarged. RESPIRATORY:  Lungs are clear, and breath sounds are equal bilaterally. Normal respiratory effort without pathologic use of accessory muscles. CARDIOVASCULAR: Heart is regular without murmurs, gallops, or rubs. GI: The abdomen is soft, tender in the left lower quadrant no evidence of peritonitis There were no palpable masses. There was no  hepatosplenomegaly. There were normal bowel sounds. MUSCULOSKELETAL:  Normal muscle strength and tone in all four extremities.    SKIN: Skin turgor is normal. There are no pathologic skin lesions.  NEUROLOGIC:  Motor and sensation is grossly normal.  Cranial nerves are grossly intact. PSYCH:  Alert and oriented to person, place and time. Affect is normal.  Data Reviewed White count and creatinine is normal CT scan personally reviewed and there is evidence of  very mild diverticulitis no evidence of abscesses nor evidence of perforation and no evidence of free air  I have personally reviewed the patient's imaging and medical records.    Assessment/Plan    Recurrent diverticulitis with failed outpatient treatment in need for admission for IV antibiotics and nothing by mouth and IV fluids for dehydration. Currently no need for surgical intervention at this time. Discussed with her in detail about the eventual need for an elective sigmoid colectomy to prevent the recurrent episodes she understands the plan and agrees with it.  Face-to-face time spent with the patient and care providers was 70 minutes, with more than 50% of the time spent counseling, educating, and coordinating care of the patient.     Analyn Matusek F Nicoletta Hush 05/20/2015, 6:32 PM

## 2015-05-20 NOTE — Progress Notes (Signed)
Subjective:    Patient ID: Michelle Boyle, female    DOB: 1956-02-03, 60 y.o.   MRN: [id number]  HPI: Michelle Boyle is a 60 y.o. female presenting on 05/20/2015 for Diverticulitis   HPI  Pt presents for ER follow-up for diverticulitis and vomiting. Seen at [facility] on 1/25.  She is still vomiting. Not able to keep medications down. Not able to take antibiotics. Has been throwing up everything. Has still not had a BM.  Notes that symptoms are worse today than Wednesday. There was a question of whether she was a surgical case at [facility].  She declined surgery and was told to follow-up with PCP in 2-3 days after trying antibiotics.  BM a few days ago- 2 pellets. Unable to keep food down since yesterday morning- grilled cheese. Stayed down. Vomiting began this morning.   Past Medical History  Diagnosis Date  . Back pain   . Palpitations   . Allergy   . Arthritis   . Carpal tunnel syndrome   . Bunion     Current Outpatient Prescriptions on File Prior to Visit  Medication Sig  . atenolol (TENORMIN) 50 MG tablet Take 1 tablet (50 mg total) by mouth daily.  Marland Kitchen oxyCODONE-acetaminophen (ROXICET) 5-325 MG tablet Take 1-2 tablets by mouth every 4 (four) hours as needed for severe pain.  Marland Kitchen albuterol (VENTOLIN HFA) 108 (90 BASE) MCG/ACT inhaler Inhale into the lungs. Reported on 05/20/2015  . atorvastatin (LIPITOR) 40 MG tablet Take 1 tablet (40 mg total) by mouth daily. (Patient not taking: Reported on 05/20/2015)  . cetirizine (ZYRTEC) 10 MG tablet Take 1 tablet (10 mg total) by mouth daily. (Patient not taking: Reported on 05/20/2015)  . etodolac (LODINE) 200 MG capsule Take 2 capsules (400 mg total) by mouth 3 (three) times daily. (Patient not taking: Reported on 05/20/2015)  . fluticasone (FLONASE) 50 MCG/ACT nasal spray Place 2 sprays into both nostrils daily. (Patient not taking: Reported on 05/20/2015)  . ibuprofen (ADVIL,MOTRIN) 800 MG tablet Take 1 tablet (800 mg total) by mouth every 8 (eight) hours as  needed. (Patient not taking: Reported on 05/20/2015)  . Multiple Vitamin (MULTI-VITAMINS) TABS Take by mouth. Reported on 05/20/2015  . predniSONE (DELTASONE) 50 MG tablet Take 1 tablet (50 mg total) by mouth daily with breakfast. (Patient not taking: Reported on 05/20/2015)  . promethazine (PHENERGAN) 25 MG tablet Take 1 tablet (25 mg total) by mouth every 6 (six) hours as needed for nausea or vomiting. (Patient not taking: Reported on 05/20/2015)  . sertraline (ZOLOFT) 100 MG tablet Take 100 mg by mouth daily. Reported on 05/20/2015   No current facility-administered medications on file prior to visit.    Review of Systems  Constitutional: Negative for fever and fatigue.  Gastrointestinal: Positive for nausea, vomiting, abdominal pain and constipation. Negative for rectal pain.  Neurological: Positive for weakness and light-headedness.   Per HPI unless specifically indicated above     Objective:    BP 113/72 mmHg  Pulse 79  Temp(Src) 98 F (36.7 C) (Oral)  Resp 16  Ht  (1.626 m)  Wt 171 lb (77.565 kg)  BMI 29.34 kg/m2  LMP  (LMP Unknown)  Wt Readings from Last 3 Encounters:  05/20/15 171 lb (77.565 kg)  05/03/15 164 lb (74.39 kg)  03/15/15 164 lb (74.39 kg)    Physical Exam  Constitutional: She is oriented to person, place, and time. Vital signs are normal. She appears ill. No distress.  HENT:  Head: Normocephalic and  atraumatic.  Mouth/Throat: Uvula is midline. Mucous membranes are dry.  Eyes: EOM are normal. Pupils are equal, round, and reactive to light.  Cardiovascular: Normal rate and regular rhythm.  Exam reveals no gallop and no friction rub.   No murmur heard. Pulmonary/Chest: Effort normal and breath sounds normal. She has no wheezes. She exhibits no tenderness.  Abdominal: Soft. She exhibits distension. She exhibits no fluid wave and no ascites. Bowel sounds are decreased. There is no hepatomegaly. There is tenderness in the left upper quadrant and left lower  quadrant. There is no rigidity, no rebound, no guarding, no CVA tenderness, no tenderness at McBurney's point and negative Murphy's sign.  Bowel sounds decreased in lower quadrants. Exquisitely tender LLQ.   Neurological: She is alert and oriented to person, place, and time.  Skin: Skin is warm and dry. She is not diaphoretic. There is pallor.   Results for orders placed or performed during the hospital encounter of 03/15/15  Basic metabolic panel  Result Value Ref Range   Sodium 136 135 - 145 mmol/L   Potassium 3.8 3.5 - 5.1 mmol/L   Chloride 103 101 - 111 mmol/L   CO2 25 22 - 32 mmol/L   Glucose, Bld 104 (H) 65 - 99 mg/dL   BUN 15 6 - 20 mg/dL   Creatinine, Ser 1.61 0.44 - 1.00 mg/dL   Calcium 9.6 8.9 - 09.6 mg/dL   GFR calc non Af Amer >60 >60 mL/min   GFR calc Af Amer >60 >60 mL/min   Anion gap 8 5 - 15  CBC  Result Value Ref Range   WBC 7.6 3.6 - 11.0 K/uL   RBC 4.56 3.80 - 5.20 MIL/uL   Hemoglobin 14.3 12.0 - 16.0 g/dL   HCT 04.5 40.9 - 81.1 %   MCV 94.3 80.0 - 100.0 fL   MCH 31.5 26.0 - 34.0 pg   MCHC 33.4 32.0 - 36.0 g/dL   RDW 91.4 78.2 - 95.6 %   Platelets 210 150 - 440 K/uL  Troponin I  Result Value Ref Range   Troponin I <0.03 <0.031 ng/mL      Assessment & Plan:   Problem List Items Addressed This Visit      Digestive   Diverticulitis of colon - Primary    [facility] wanted to surgically intervene on Wednesday however pt declined. Symptoms are not improving because she is unable to keep down fluids or medications.  Pt sent to [facility] for further evaluation.       Relevant Medications   amoxicillin-clavulanate (AUGMENTIN) 875-125 MG tablet   Other Relevant Orders   DG Abd 2 Views (Completed)    Other Visit Diagnoses    Intractable vomiting with nausea, vomiting of unspecified type        Sent via EMS to ER for evaluation of dehydration and nausea/vomitting.     Relevant Orders    DG Abd 2 Views (Completed)    Dehydration, mild           Meds ordered this  encounter  Medications  . amoxicillin-clavulanate (AUGMENTIN) 875-125 MG tablet    Sig: Take by mouth.      Follow up plan: No Follow-up on file.

## 2015-05-20 NOTE — ED Notes (Signed)
Pt comes into the ED via EMS from [facility] c/o constant nausea.  Patient recently diagnosed with diverticulitis from [facility] and she declined surgical intervention.  Patient given 4 zofran in route.  VS stable and 18 g L AC.

## 2015-05-20 NOTE — ED Notes (Signed)
Patient transported to CT 

## 2015-05-20 NOTE — Assessment & Plan Note (Signed)
[facility] wanted to surgically intervene on Wednesday however pt declined. Symptoms are not improving because she is unable to keep down fluids or medications.  Pt sent to [facility] for further evaluation.

## 2015-05-21 DIAGNOSIS — K5732 Diverticulitis of large intestine without perforation or abscess without bleeding: Secondary | ICD-10-CM

## 2015-05-21 LAB — CBC
HEMATOCRIT: 35.4 % (ref 35.0–47.0)
Hemoglobin: 12.2 g/dL (ref 12.0–16.0)
MCH: 31.7 pg (ref 26.0–34.0)
MCHC: 34.4 g/dL (ref 32.0–36.0)
MCV: 92.2 fL (ref 80.0–100.0)
Platelets: 269 10*3/uL (ref 150–440)
RBC: 3.84 MIL/uL (ref 3.80–5.20)
RDW: 12.3 % (ref 11.5–14.5)
WBC: 5.6 10*3/uL (ref 3.6–11.0)

## 2015-05-21 MED ORDER — PROMETHAZINE HCL 25 MG/ML IJ SOLN
12.5000 mg | Freq: Once | INTRAMUSCULAR | Status: DC
  Filled 2015-05-21: qty 1

## 2015-05-21 MED ORDER — PROMETHAZINE HCL 25 MG/ML IJ SOLN
12.5000 mg | Freq: Once | INTRAMUSCULAR | Status: AC
  Administered 2015-05-21: 10:00:00 via INTRAVENOUS

## 2015-05-21 NOTE — Progress Notes (Signed)
Subjective:   She is persistently nauseated and has mild left lower quadrant pain. She did pass a small amount of blood with her bowel movement. She's not had any vomiting. She just feels pretty miserable.  Vital signs in last 24 hours: Temp:  [97.7 F (36.5 C)-98.5 F (36.9 C)] 97.7 F (36.5 C) (01/28 0838) Pulse Rate:  [54-79] 58 (01/28 0838) Resp:  [14-20] 16 (01/28 0838) BP: (94-136)/(58-84) 94/61 mmHg (01/28 0838) SpO2:  [97 %-100 %] 100 % (01/28 0838) Weight:  [76.204 kg (168 lb)-77.565 kg (171 lb)] 76.204 kg (168 lb) (01/27 2005) Last BM Date: 05/21/15  Intake/Output from previous day: 01/27 0701 - 01/28 0700 In: 1047.5 [I.V.:1047.5] Out: 101 [Urine:100; Stool:1]  Exam:  Her abdomen is soft with some mild left lower quadrant tenderness. She has no rebound or guarding. She has active bowel sounds. Her chest is clear with normal adventitious sounds and she has normal pulmonary excursion. Cardiac exam. No murmurs or gallops.  Lab Results:  CBC  Recent Labs  05/20/15 1610 05/21/15 0458  WBC 8.2 5.6  HGB 13.3 12.2  HCT 38.2 35.4  PLT 301 269   CMP     Component Value Date/Time   NA 142 05/20/2015 1610   NA 142 01/04/2015 1010   K 3.3* 05/20/2015 1610   CL 103 05/20/2015 1610   CO2 30 05/20/2015 1610   GLUCOSE 100* 05/20/2015 1610   GLUCOSE 84 01/04/2015 1010   BUN 14 05/20/2015 1610   BUN 14 01/04/2015 1010   CREATININE 0.83 05/20/2015 1610   CALCIUM 9.3 05/20/2015 1610   PROT 7.3 05/20/2015 1610   PROT 7.1 01/04/2015 1010   ALBUMIN 4.2 05/20/2015 1610   ALBUMIN 4.7 01/04/2015 1010   AST 19 05/20/2015 1610   ALT 29 05/20/2015 1610   ALKPHOS 56 05/20/2015 1610   BILITOT 0.8 05/20/2015 1610   BILITOT 0.6 01/04/2015 1010   GFRNONAA >60 05/20/2015 1610   GFRAA >60 05/20/2015 1610   PT/INR No results for input(s): LABPROT, INR in the last 72 hours.  Studies/Results: Ct Abdomen Pelvis W Contrast  05/20/2015  CLINICAL DATA:  Left lower quadrant abdominal  pain and vomiting for 1 month. EXAM: CT ABDOMEN AND PELVIS WITH CONTRAST TECHNIQUE: Multidetector CT imaging of the abdomen and pelvis was performed using the standard protocol following bolus administration of intravenous contrast. CONTRAST:  OMNIPAQUE IOHEXOL 300 MG/ML  SOLN COMPARISON:  No comparison studies available. FINDINGS: Lower chest:  Unremarkable. Hepatobiliary: No focal abnormality within the liver parenchyma. Gallbladder is surgically absent. No intrahepatic or extrahepatic biliary dilation. Pancreas: Mild prominence of the main pancreatic duct noted through the pancreatic head, but body and tail portions of adductor nondilated. These discrete pancreatic mass evident. Spleen: No splenomegaly. No focal mass lesion. Adrenals/Urinary Tract: No adrenal nodule or mass. Horseshoe kidney anatomy identified with no suspicious or enhancing lesion associated. No evidence for hydroureter. Bladder is decompressed. Stomach/Bowel: Stomach is nondistended. No gastric wall thickening. No evidence of outlet obstruction. Duodenum is normally positioned as is the ligament of Treitz. No small bowel wall thickening. No small bowel dilatation. The terminal ileum is normal. Diverticuli are seen scattered along the entire length of the colon. The diverticular changes are most advanced in the sigmoid segment. A subtle small area of pericolonic edema/inflammation is identified in the sigmoid mesocolon (image 73 series 2 and image 73 series 5). Vascular/Lymphatic: There is abdominal aortic atherosclerosis without aneurysm. There is no gastrohepatic or hepatoduodenal ligament lymphadenopathy. No intraperitoneal or retroperitoneal  lymphadenopathy. No pelvic sidewall lymphadenopathy. Reproductive: Uterus is surgically absent. There is no adnexal mass. Other: No intraperitoneal free fluid. Musculoskeletal: Patient is status post lower lumbar fusion Bone windows reveal no worrisome lytic or sclerotic osseous lesions.  IMPRESSION: 1. Very subtle pericolonic edema/inflammation in the proximal sigmoid segment, suggesting diverticulitis. 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 3. Abdominal aortic atherosclerosis. Electronically Signed   By: Kennith Center M.D.   On: 05/20/2015 18:00   Dg Abd 2 Views  05/20/2015  CLINICAL DATA:  Nausea, vomiting and abdominal pain for 1 month. EXAM: ABDOMEN - 2 VIEW COMPARISON:  05/03/2015 lumbar spine radiographs FINDINGS: A few nondistended gas and fluid-filled loops of bowel are noted. There is no evidence of dilated bowel loops or pneumoperitoneum. Stool in the rectum is present. Cholecystectomy clips and lower lumbar spine surgical hardware again noted. No acute bony abnormalities are identified. No suspicious calcifications are noted. IMPRESSION: Nonspecific nonobstructive bowel gas pattern. No evidence of pneumoperitoneum. Electronically Signed   By: Harmon Pier M.D.   On: 05/20/2015 14:58    Assessment/Plan: Overall she seems to be improved. Her nausea will be treated symptomatically. We'll continue her IV antibiotics. I will recheck her hemoglobin with blood in her stool. Multiple episodes she may need to consider surgical intervention. I agree that there is no indication for urgent surgery at this time.

## 2015-05-21 NOTE — Progress Notes (Signed)
Patient complained of continued nausea with no relief from zofran.  Contacted physician, Dr. Marshia Ly, and received order for phenergan 12.5 to be given IV followed by a second dose in two hours if no relief from first dose.

## 2015-05-22 LAB — CBC
HCT: 31 % — ABNORMAL LOW (ref 35.0–47.0)
Hemoglobin: 10.7 g/dL — ABNORMAL LOW (ref 12.0–16.0)
MCH: 31.7 pg (ref 26.0–34.0)
MCHC: 34.6 g/dL (ref 32.0–36.0)
MCV: 91.6 fL (ref 80.0–100.0)
PLATELETS: 217 10*3/uL (ref 150–440)
RBC: 3.39 MIL/uL — ABNORMAL LOW (ref 3.80–5.20)
RDW: 12.4 % (ref 11.5–14.5)
WBC: 5.5 10*3/uL (ref 3.6–11.0)

## 2015-05-22 NOTE — Progress Notes (Signed)
Subjective:   He was nauseated again last night and vomited a couplef times. She's having no significant pain today but still feels uncomfortable  Vital signs in last 24 hours: Temp:  [97.6 F (36.4 C)-98.9 F (37.2 C)] 98.2 F (36.8 C) (01/29 0644) Pulse Rate:  [57-67] 67 (01/29 0644) Resp:  [16-20] 20 (01/29 0644) BP: (97-114)/(63-68) 106/64 mmHg (01/29 0644) SpO2:  [97 %-100 %] 97 % (01/29 0644) Last BM Date: 05/21/15  Intake/Output from previous day: 01/28 0701 - 01/29 0700 In: 3397.2 [I.V.:3337.6; IV Piggyback:59.6] Out: 1125 [Urine:1125]  Exam:er abdomen is soft with minimal left lower quadrant tenderness no rebound and no guarding.    Lab Results:  CBC  Recent Labs  05/21/15 0458 05/22/15 0504  WBC 5.6 5.5  HGB 12.2 10.7*  HCT 35.4 31.0*  PLT 269 217   CMP     Component Value Date/Time   NA 142 05/20/2015 1610   NA 142 01/04/2015 1010   K 3.3* 05/20/2015 1610   CL 103 05/20/2015 1610   CO2 30 05/20/2015 1610   GLUCOSE 100* 05/20/2015 1610   GLUCOSE 84 01/04/2015 1010   BUN 14 05/20/2015 1610   BUN 14 01/04/2015 1010   CREATININE 0.83 05/20/2015 1610   CALCIUM 9.3 05/20/2015 1610   PROT 7.3 05/20/2015 1610   PROT 7.1 01/04/2015 1010   ALBUMIN 4.2 05/20/2015 1610   ALBUMIN 4.7 01/04/2015 1010   AST 19 05/20/2015 1610   ALT 29 05/20/2015 1610   ALKPHOS 56 05/20/2015 1610   BILITOT 0.8 05/20/2015 1610   BILITOT 0.6 01/04/2015 1010   GFRNONAA >60 05/20/2015 1610   GFRAA >60 05/20/2015 1610   PT/INR No results for input(s): LABPROT, INR in the last 72 hours.  Studies/Results: Ct Abdomen Pelvis W Contrast  05/20/2015  CLINICAL DATA:  Left lower quadrant abdominal pain and vomiting for 1 month. EXAM: CT ABDOMEN AND PELVIS WITH CONTRAST TECHNIQUE: Multidetector CT imaging of the abdomen and pelvis was performed using the standard protocol following bolus administration of intravenous contrast. CONTRAST:  OMNIPAQUE IOHEXOL 300 MG/ML  SOLN  COMPARISON:  No comparison studies available. FINDINGS: Lower chest:  Unremarkable. Hepatobiliary: No focal abnormality within the liver parenchyma. Gallbladder is surgically absent. No intrahepatic or extrahepatic biliary dilation. Pancreas: Mild prominence of the main pancreatic duct noted through the pancreatic head, but body and tail portions of adductor nondilated. These discrete pancreatic mass evident. Spleen: No splenomegaly. No focal mass lesion. Adrenals/Urinary Tract: No adrenal nodule or mass. Horseshoe kidney anatomy identified with no suspicious or enhancing lesion associated. No evidence for hydroureter. Bladder is decompressed. Stomach/Bowel: Stomach is nondistended. No gastric wall thickening. No evidence of outlet obstruction. Duodenum is normally positioned as is the ligament of Treitz. No small bowel wall thickening. No small bowel dilatation. The terminal ileum is normal. Diverticuli are seen scattered along the entire length of the colon. The diverticular changes are most advanced in the sigmoid segment. A subtle small area of pericolonic edema/inflammation is identified in the sigmoid mesocolon (image 73 series 2 and image 73 series 5). Vascular/Lymphatic: There is abdominal aortic atherosclerosis without aneurysm. There is no gastrohepatic or hepatoduodenal ligament lymphadenopathy. No intraperitoneal or retroperitoneal lymphadenopathy. No pelvic sidewall lymphadenopathy. Reproductive: Uterus is surgically absent. There is no adnexal mass. Other: No intraperitoneal free fluid. Musculoskeletal: Patient is status post lower lumbar fusion Bone windows reveal no worrisome lytic or sclerotic osseous lesions. IMPRESSION: 1. Very subtle pericolonic edema/inflammation in the proximal sigmoid segment, suggesting diverticulitis. 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 3. Abdominal aortic atherosclerosis. Electronically Signed   By: Kennith Center M.D.   On: 05/20/2015 18:00   Dg Abd 2  Views  05/20/2015  CLINICAL DATA:  Nausea, vomiting and abdominal pain for 1 month. EXAM: ABDOMEN - 2 VIEW COMPARISON:  05/03/2015 lumbar spine radiographs FINDINGS: A few nondistended gas and fluid-filled loops of bowel are noted. There is no evidence of dilated bowel loops or pneumoperitoneum. Stool in the rectum is present. Cholecystectomy clips and lower lumbar spine surgical hardware again noted. No acute bony abnormalities are identified. No suspicious calcifications are noted. IMPRESSION: Nonspecific nonobstructive bowel gas pattern. No evidence of pneumoperitoneum. Electronically Signed   By: Harmon Pier M.D.   On: 05/20/2015 14:58    Assessment/Plan: : Remains low. She does not have any significant peritoneal tenderness. Persistent nausea I am afraid advance her diet at this point. Question of chronic diverticulitis raises the issue of possible surgical intervention. However, her CT scan is not impressive and surgery would be a major step possibly including a temporary colostomy. The present time I would continue her antibiotic therapy. She is in agreement with this plan.

## 2015-05-23 MED ORDER — MORPHINE SULFATE (PF) 2 MG/ML IV SOLN: 2.0000 mg | INTRAVENOUS | Status: DC | PRN

## 2015-05-23 MED ORDER — DOCUSATE SODIUM 100 MG PO CAPS
100.0000 mg | ORAL_CAPSULE | Freq: Two times a day (BID) | ORAL | Status: DC
  Administered 2015-05-23: 100 mg via ORAL
  Filled 2015-05-23: qty 1

## 2015-05-23 MED ORDER — POLYETHYLENE GLYCOL 3350 17 G PO PACK: 17.0000 g | PACK | Freq: Every day | ORAL | Status: AC

## 2015-05-23 MED ORDER — LACTULOSE 10 GM/15ML PO SOLN
20.0000 g | Freq: Once | ORAL | Status: AC
  Administered 2015-05-23: 20 g via ORAL
  Filled 2015-05-23: qty 30

## 2015-05-23 MED ORDER — AMOXICILLIN-POT CLAVULANATE 875-125 MG PO TABS
1.0000 | ORAL_TABLET | Freq: Two times a day (BID) | ORAL | Status: DC
  Administered 2015-05-23: 1 via ORAL
  Filled 2015-05-23: qty 1

## 2015-05-23 MED ORDER — OXYCODONE-ACETAMINOPHEN 5-325 MG PO TABS: 1.0000 | ORAL_TABLET | ORAL | Status: AC | PRN

## 2015-05-23 MED ORDER — POLYETHYLENE GLYCOL 3350 17 G PO PACK
17.0000 g | PACK | Freq: Every day | ORAL | Status: DC
  Filled 2015-05-23: qty 1

## 2015-05-23 MED ORDER — AMOXICILLIN-POT CLAVULANATE 875-125 MG PO TABS: 1.0000 | ORAL_TABLET | Freq: Two times a day (BID) | ORAL | Status: AC

## 2015-05-23 MED ORDER — PANTOPRAZOLE SODIUM 40 MG PO TBEC
40.0000 mg | DELAYED_RELEASE_TABLET | Freq: Every day | ORAL | Status: DC
  Administered 2015-05-23: 40 mg via ORAL
  Filled 2015-05-23: qty 1

## 2015-05-23 NOTE — Care Management Note (Signed)
Case Management Note  Patient Details  Name: Michelle Boyle MRN: [id number] Date of Birth: 04/01/1956  Subjective/Objective:   Discharge to home. No home health orders. Case Management signing off.                  Action/Plan:   Expected Discharge Date:                  Expected Discharge Plan:     In-House Referral:     Discharge planning Services     Post Acute Care Choice:    Choice offered to:     DME Arranged:    DME Agency:     HH Arranged:    HH Agency:     Status of Service:     Medicare Important Message Given:    Date Medicare IM Given:    Medicare IM give by:    Date Additional Medicare IM Given:    Additional Medicare Important Message give by:     If discussed at Long Length of Stay Meetings, dates discussed:    Additional Comments:  Vedika Dumlao A, RN 05/23/2015, 10:31 AM

## 2015-05-23 NOTE — Progress Notes (Signed)
Pt A and O x 4. VSS. Pt tolerating diet well. Minimal; complaints of pain or nausea, with no meds required. IV removed intact, prescriptions given. Pt voiced understanding of discharge instructions with no further questions. Pt discharged via wheelchair with nurse aide.

## 2015-05-23 NOTE — Discharge Summary (Signed)
Physician Discharge Summary  Patient ID: Michelle Boyle MRN: [id number] DOB/AGE: 59/15/1957 60 y.o.  Admit date: 05/20/2015 Discharge date: 05/23/2015  Admission Diagnoses: Diverticulitis  Discharge Diagnoses:  Active Problems:   Diverticulitis of large intestine without perforation or abscess without bleeding Chronic nausea  Discharged Condition: good  Hospital Course: 60 yr old with recurrent diverticulitis.  Patient has been doing well, up and moving around and on IV Zosyn.  Abdominal pain doing much better.  She has been having some stool but has been constipated chronically as well.  Discussed with her that constipation can make her nausea worse and need to keep her regulated.  Patient also states that she has some nausea, but that this is a chronic problem and she sometimes feels as if foods and liquids stick in her throat.  She has seen a GI doctor in the past but it has been years  Consults: None  Significant Diagnostic Studies: CT scan  Treatments: antibiotics: Zosyn  Discharge Exam: Blood pressure 117/52, pulse 59, temperature 97.7 F (36.5 C), temperature source Oral, resp. rate 23, height  (1.626 m), weight 168 lb (76.204 kg), SpO2 100 %. General appearance: alert, cooperative, no distress and mildly obese GI: soft, mildly tender in suprapubic area, non-distended Extremities: extremities normal, atraumatic, no cyanosis or edema  Disposition: 01-Home or Self Care  Discharge Instructions    Activity as tolerated - No restrictions    Complete by:  As directed      Call MD for:  persistant nausea and vomiting    Complete by:  As directed      Call MD for:  severe uncontrolled pain    Complete by:  As directed      Call MD for:  temperature >100.4    Complete by:  As directed      Diet - low sodium heart healthy    Complete by:  As directed      May shower / Bathe    Complete by:  As directed      No wound care    Complete by:  As directed              Medication List    STOP taking these medications        predniSONE 50 MG tablet  Commonly known as:  DELTASONE      TAKE these medications        albuterol 108 (90 Base) MCG/ACT inhaler  Commonly known as:  PROVENTIL HFA;VENTOLIN HFA  Inhale 2 puffs into the lungs every 6 (six) hours as needed for wheezing or shortness of breath.     atenolol 50 MG tablet  Commonly known as:  TENORMIN  Take 50 mg by mouth at bedtime.     atorvastatin 40 MG tablet  Commonly known as:  LIPITOR  Take 40 mg by mouth at bedtime.     AUGMENTIN 875-125 MG tablet  Generic drug:  amoxicillin-clavulanate  Take 1 tablet by mouth 2 (two) times daily.     cetirizine 10 MG tablet  Commonly known as:  ZYRTEC  Take 1 tablet (10 mg total) by mouth daily.     docusate sodium 100 MG capsule  Commonly known as:  COLACE  Take 100 mg by mouth 2 (two) times daily as needed for mild constipation.     etodolac 200 MG capsule  Commonly known as:  LODINE  Take 2 capsules (400 mg total) by mouth 3 (three) times daily.  fluticasone 50 MCG/ACT nasal spray  Commonly known as:  FLONASE  Place 2 sprays into both nostrils daily.     ibuprofen 800 MG tablet  Commonly known as:  ADVIL,MOTRIN  Take 1 tablet (800 mg total) by mouth every 8 (eight) hours as needed.     multivitamin with minerals Tabs tablet  Take 1 tablet by mouth daily.     oxyCODONE-acetaminophen 5-325 MG tablet  Commonly known as:  ROXICET  Take 1-2 tablets by mouth every 4 (four) hours as needed for severe pain.     polyethylene glycol packet  Commonly known as:  MIRALAX / GLYCOLAX  Take 17 g by mouth daily.     promethazine 25 MG tablet  Commonly known as:  PHENERGAN  Take 1 tablet (25 mg total) by mouth every 6 (six) hours as needed for nausea or vomiting.     sertraline 100 MG tablet  Commonly known as:  ZOLOFT  Take 100 mg by mouth daily.           Follow-up Information    [facility]    Why:  f/u Diverticulitis      Follow up with Darlina Rumpf, MD In 4 weeks.   Specialty:  Gastroenterology   Why:  Chronic Diverticulitis and GERD   Contact information:   [address] [place] [address] [phone]       Signed: Gladis Riffle 05/23/2015, 9:36 AM

## 2015-05-24 ENCOUNTER — Telehealth: Payer: Self-pay | Admitting: Family Medicine

## 2015-05-24 NOTE — Telephone Encounter (Signed)
Called pt to discuss hospitalization for diverticulitis. LMTCB.  If she calls back- please find out if she would like to go [facility] GI surgery for her consultation or stick with [facility].

## 2015-05-25 NOTE — Telephone Encounter (Signed)
Spoke to the daughter pt is at [facility] emergency dept she still can't keep anything down and still very sick.

## 2015-06-02 ENCOUNTER — Ambulatory Visit: Payer: Self-pay | Admitting: Surgery

## 2015-06-03 ENCOUNTER — Telehealth: Payer: Self-pay | Admitting: Family Medicine

## 2015-06-03 ENCOUNTER — Other Ambulatory Visit: Payer: Self-pay | Admitting: Family Medicine

## 2015-06-03 MED ORDER — SERTRALINE HCL 100 MG PO TABS: 100.0000 mg | ORAL_TABLET | Freq: Every day | ORAL | Status: AC

## 2015-06-03 NOTE — Telephone Encounter (Signed)
Michelle Boyle from [facility] needs a verbal order for nursing 3 times a week for 2 weeks, 2 times a week for 1 week and once a week for 5 weeks.  She also needs an order to pack abd wound wet to dry.  Her call back number is [phone]

## 2015-06-03 NOTE — Telephone Encounter (Signed)
Is this something you want Amy please suggest ?

## 2015-06-03 NOTE — Telephone Encounter (Signed)
Verbal order given via message.

## 2015-06-06 ENCOUNTER — Telehealth: Payer: Self-pay | Admitting: Family Medicine

## 2015-06-06 NOTE — Telephone Encounter (Signed)
error 

## 2015-06-06 NOTE — Telephone Encounter (Signed)
Michelle Boyle with [facility], occupational therapist needs a verbal for duration and frequency for therapy twice a week for 4 weeks.  Please call [phone]

## 2015-06-07 NOTE — Telephone Encounter (Signed)
Left detail message and gave verbal.

## 2015-06-07 NOTE — Telephone Encounter (Signed)
LMTCB for Laurie.

## 2015-06-27 ENCOUNTER — Ambulatory Visit: Payer: Self-pay | Admitting: Gastroenterology

## 2015-07-06 ENCOUNTER — Other Ambulatory Visit: Payer: Self-pay | Admitting: Family Medicine

## 2015-07-06 ENCOUNTER — Other Ambulatory Visit: Payer: Self-pay

## 2015-07-06 ENCOUNTER — Telehealth: Payer: Self-pay | Admitting: Family Medicine

## 2015-07-06 DIAGNOSIS — Z789 Other specified health status: Secondary | ICD-10-CM

## 2015-07-06 NOTE — Telephone Encounter (Signed)
[facility] so LM for pt that she can transfer her [facility].

## 2015-07-06 NOTE — Telephone Encounter (Signed)
Please [facility] a [facility] [address] [place] [phone]

## 2015-07-07 NOTE — Telephone Encounter (Signed)
Called pharmacy and gave verbal for 1 refill some how they could not see our Rx send out from 02/2015 with 11 refill which was transfer [facility] to [facility] and [facility] could not see any refill either.

## 2016-05-03 ENCOUNTER — Other Ambulatory Visit: Payer: Self-pay | Admitting: Family Medicine

## 2016-05-03 MED ORDER — ATENOLOL 50 MG PO TABS: 50.0000 mg | ORAL_TABLET | Freq: Every day | ORAL | 0 refills | Status: AC

## 2016-10-24 IMAGING — CT CT ABD-PELV W/ CM
2 of 5 series · 15 of 46 positions shown, 17 images · IV contrast (omnipaque)
Comparison: No comparison studies available.

CLINICAL DATA: Left lower quadrant abdominal pain and vomiting for
1 month.

EXAM:
CT ABDOMEN AND PELVIS WITH CONTRAST
TECHNIQUE: Multidetector CT imaging of the abdomen and pelvis was performed
using the standard protocol following bolus administration of
intravenous contrast.
CONTRAST:  100mL OMNIPAQUE IOHEXOL 300 MG/ML  SOLN

[Series 2: routine abd pel with · axial · 0.73mm/px · z∈[-697,-292]mm · 12 of 91 slices shown, 14 images]
[im 5/91  soft-tissue]
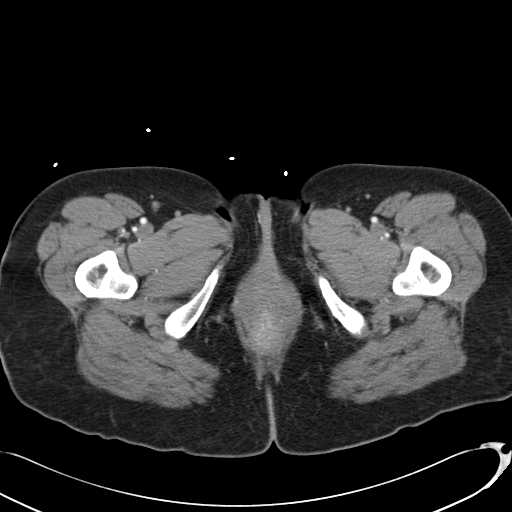
[im 5/91  bone]
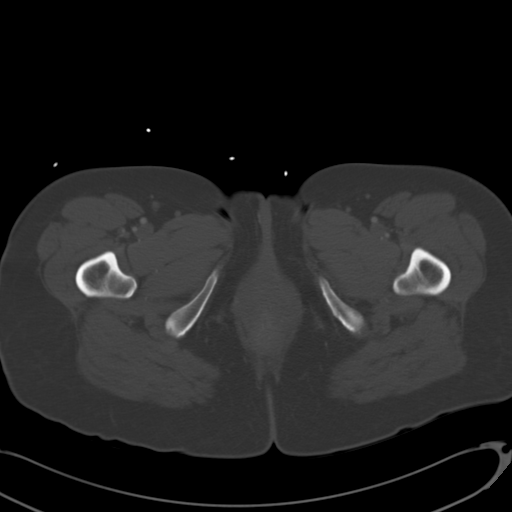
[im 15/91  soft-tissue]
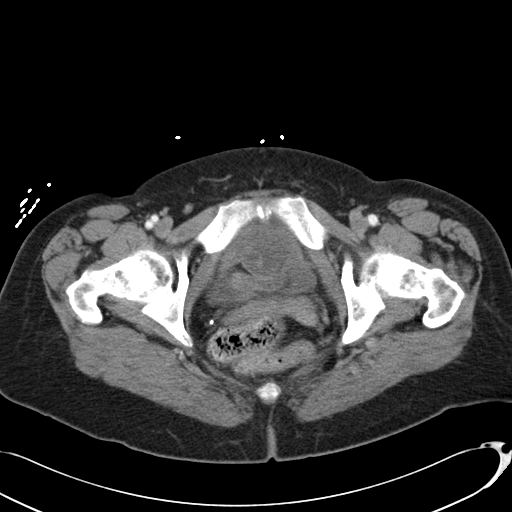
[im 19/91  soft-tissue]
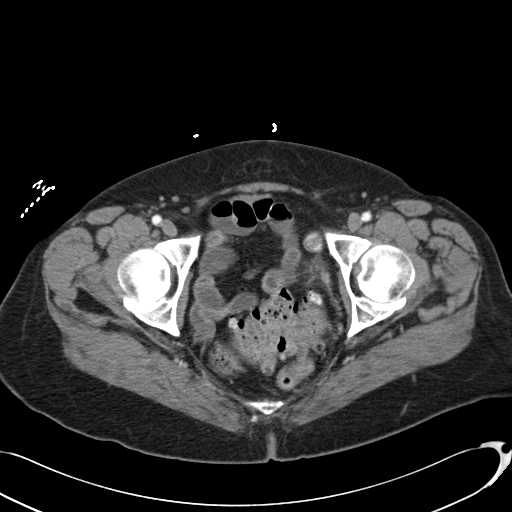
[im 29/91  soft-tissue]
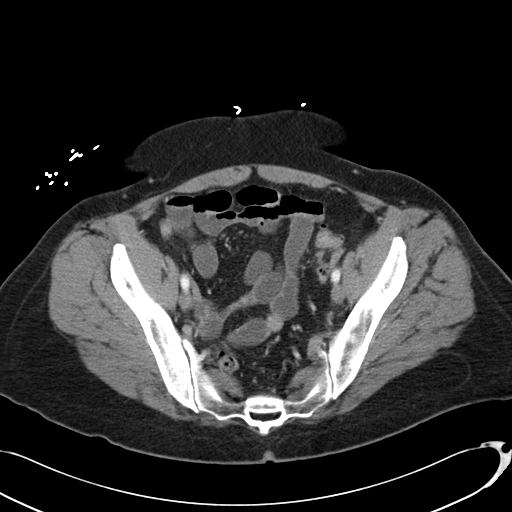
[im 34/91  soft-tissue]
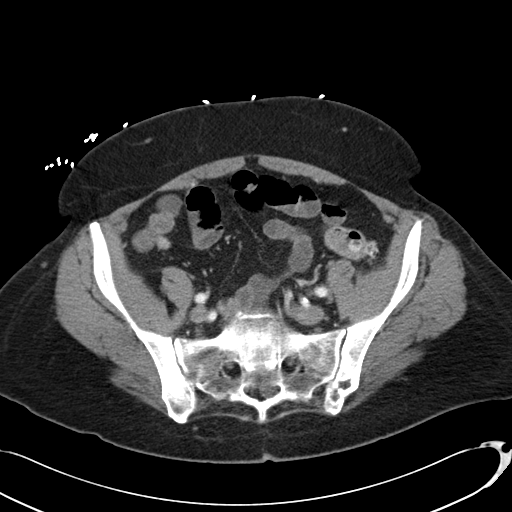
[im 43/91  soft-tissue]
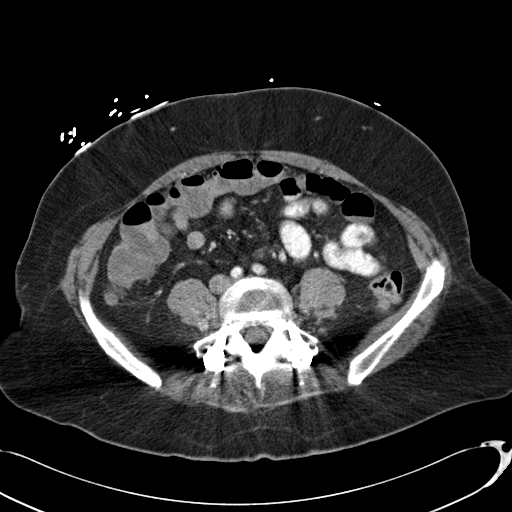
[im 48/91  soft-tissue]
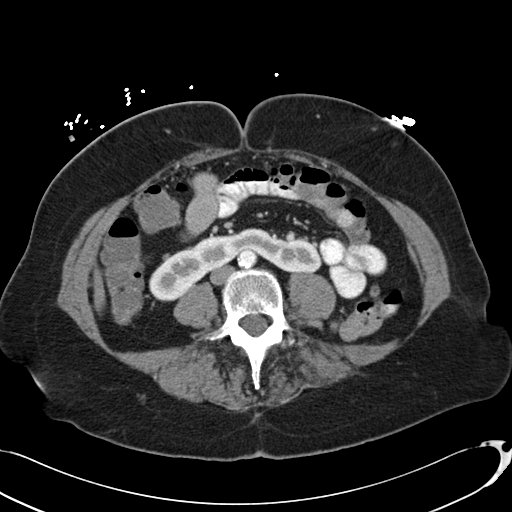
[im 57/91  soft-tissue]
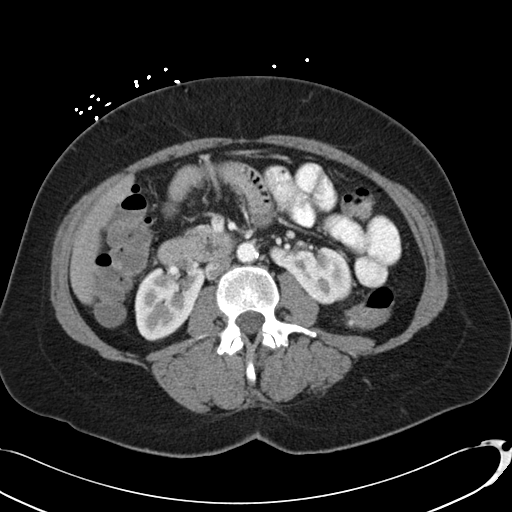
[im 62/91  soft-tissue]
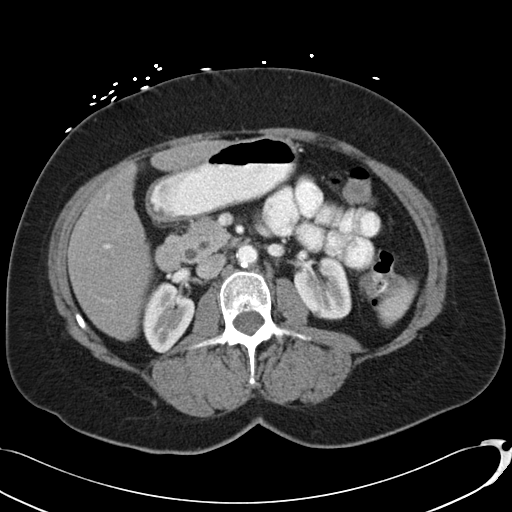
[im 62/91  bone]
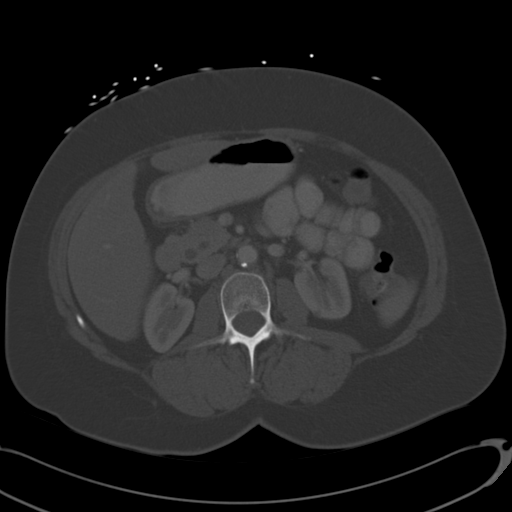
[im 72/91  soft-tissue]
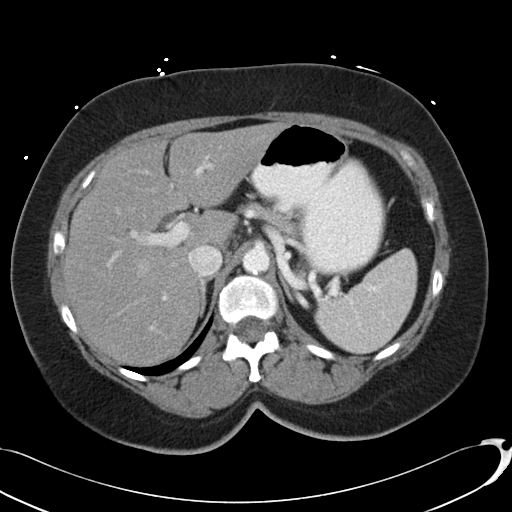
[im 76/91  soft-tissue]
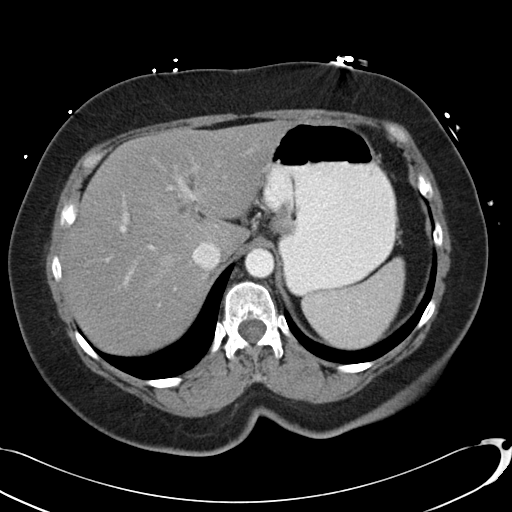
[im 86/91  soft-tissue]
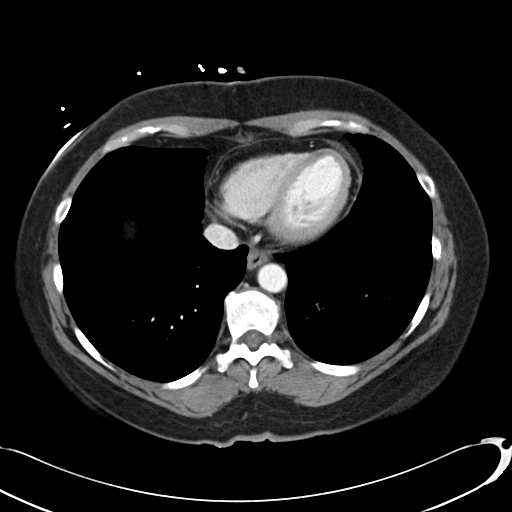

[Series 5: cor routine abd pel with · coronal · 0.66mm/px · 3 of 132 slices shown]
[im 44/132  soft-tissue]
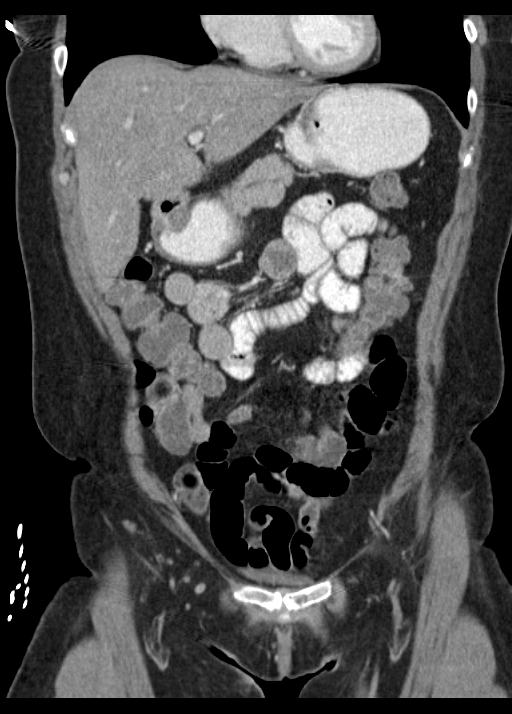
[im 59/132  soft-tissue]
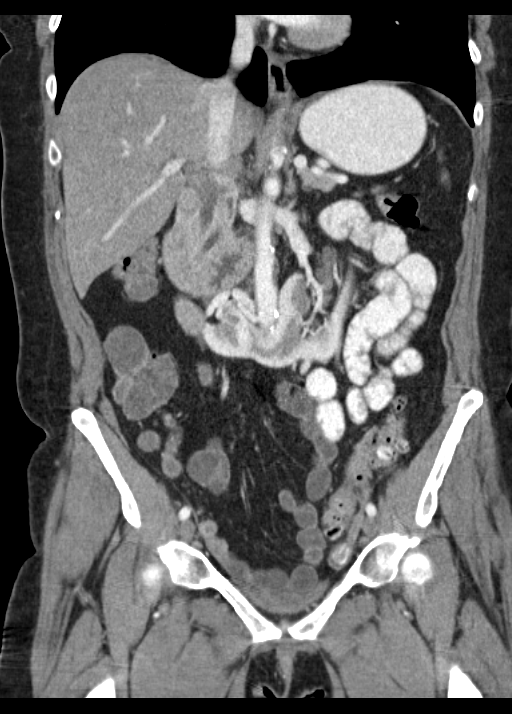
[im 73/132  soft-tissue]
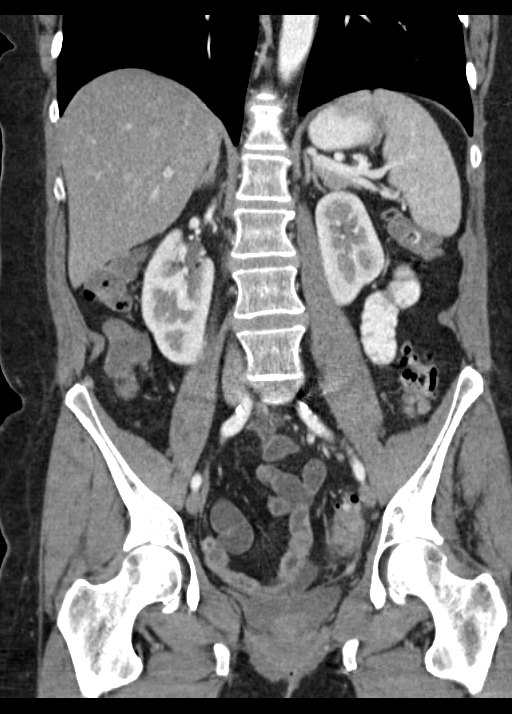

[15 of 46 positions shown; findings below may reference images not displayed]

FINDINGS: Lower chest:  Unremarkable.

Hepatobiliary: No focal abnormality within the liver parenchyma.
Gallbladder is surgically absent. No intrahepatic or extrahepatic
biliary dilation.

Pancreas: Mild prominence of the main pancreatic duct noted through
the pancreatic head, but body and tail portions of adductor
nondilated. These discrete pancreatic mass evident.

Spleen: No splenomegaly. No focal mass lesion.

Adrenals/Urinary Tract: No adrenal nodule or mass. Horseshoe kidney
anatomy identified with no suspicious or enhancing lesion
associated. No evidence for hydroureter. Bladder is decompressed.

Stomach/Bowel:

Stomach is nondistended. No gastric wall thickening. No evidence of
outlet obstruction. Duodenum is normally positioned as is the
ligament of Treitz. No small bowel wall thickening. No small bowel
dilatation. The terminal ileum is normal. Diverticuli are seen
scattered along the entire length of the colon. The diverticular
changes are most advanced in the sigmoid segment. A subtle small
area of pericolonic edema/inflammation is identified in the sigmoid
mesocolon (image 73 series 2 and image 73 series 5).

Vascular/Lymphatic: There is abdominal aortic atherosclerosis
without aneurysm. There is no gastrohepatic or hepatoduodenal
ligament lymphadenopathy. No intraperitoneal or retroperitoneal
lymphadenopathy. No pelvic sidewall lymphadenopathy.

Reproductive: Uterus is surgically absent. There is no adnexal mass.

Other: No intraperitoneal free fluid.

Musculoskeletal: Patient is status post lower lumbar fusion Bone
windows reveal no worrisome lytic or sclerotic osseous lesions.
IMPRESSION: 1. Very subtle pericolonic edema/inflammation in the proximal
sigmoid segment, suggesting diverticulitis.
2. Horseshoe kidney with no complicating features.
3. Abdominal aortic atherosclerosis.

## 2020-04-07 ENCOUNTER — Other Ambulatory Visit: Payer: Self-pay

## 2020-04-07 ENCOUNTER — Emergency Department (HOSPITAL_COMMUNITY)
Admission: EM | Admit: 2020-04-07 | Discharge: 2020-04-07 | Disposition: A | Discharge status: Home / Self Care | Payer: Medicare Other | Attending: Emergency Medicine | Admitting: Emergency Medicine

## 2020-04-07 DIAGNOSIS — R197 Diarrhea, unspecified: Secondary | ICD-10-CM | POA: Insufficient documentation

## 2020-04-07 DIAGNOSIS — R109 Unspecified abdominal pain: Secondary | ICD-10-CM | POA: Insufficient documentation

## 2020-04-07 DIAGNOSIS — R112 Nausea with vomiting, unspecified: Secondary | ICD-10-CM | POA: Diagnosis present

## 2020-04-07 DIAGNOSIS — Z5321 Procedure and treatment not carried out due to patient leaving prior to being seen by health care provider: Secondary | ICD-10-CM | POA: Diagnosis not present

## 2020-04-07 LAB — COMPREHENSIVE METABOLIC PANEL
ALT: 32 U/L (ref 0–44)
AST: 22 U/L (ref 15–41)
Albumin: 3.7 g/dL (ref 3.5–5.0)
Alkaline Phosphatase: 80 U/L (ref 38–126)
Anion gap: 13 (ref 5–15)
BUN: 13 mg/dL (ref 8–23)
CO2: 23 mmol/L (ref 22–32)
Calcium: 9.6 mg/dL (ref 8.9–10.3)
Chloride: 101 mmol/L (ref 98–111)
Creatinine, Ser: 0.92 mg/dL (ref 0.44–1.00)
GFR, Estimated: >60 mL/min (ref >60)
Glucose, Bld: 105 mg/dL — ABNORMAL HIGH (ref 70–99)
Potassium: 3.7 mmol/L (ref 3.5–5.1)
Sodium: 137 mmol/L (ref 135–145)
Total Bilirubin: 0.9 mg/dL (ref 0.3–1.2)
Total Protein: 7.5 g/dL (ref 6.5–8.1)

## 2020-04-07 LAB — URINALYSIS, ROUTINE W REFLEX MICROSCOPIC
Bacteria, UA: NONE SEEN
Bilirubin Urine: NEGATIVE
Glucose, UA: NEGATIVE mg/dL
Ketones, ur: NEGATIVE mg/dL
Nitrite: NEGATIVE
Protein, ur: 30 mg/dL — AB
Specific Gravity, Urine: 1.018 (ref 1.005–1.030)
pH: 5 (ref 5.0–8.0)

## 2020-04-07 LAB — CBC
HCT: 43 % (ref 36.0–46.0)
Hemoglobin: 14.5 g/dL (ref 12.0–15.0)
MCH: 30.8 pg (ref 26.0–34.0)
MCHC: 33.7 g/dL (ref 30.0–36.0)
MCV: 91.3 fL (ref 80.0–100.0)
Platelets: 383 10*3/uL (ref 150–400)
RBC: 4.71 MIL/uL (ref 3.87–5.11)
RDW: 11.6 % (ref 11.5–15.5)
WBC: 9 10*3/uL (ref 4.0–10.5)
nRBC: 0 % (ref 0.0–0.2)

## 2020-04-07 LAB — LIPASE, BLOOD: Lipase: 17 U/L (ref 11–51)

## 2020-04-07 NOTE — ED Triage Notes (Signed)
Pt with hx of lupus, here visiting her daughter for Christmas. Having diarrhea x 4 days. N/v this morning. Endorses L sided abdominal and flank pain. Went to UC, sent here for further eval.

## 2020-04-07 NOTE — ED Notes (Signed)
Pt leaving, refused to stay. Pt seen walking out.
